# Patient Record
Sex: Female | Born: 1985 | Race: White | Hispanic: No | Marital: Married | Smoking: Never smoker
Health system: Southern US, Community
[De-identification: ages and names within clinical notes are randomized; demographics above are authoritative.]

## PROBLEM LIST (undated history)

## (undated) ENCOUNTER — Inpatient Hospital Stay (HOSPITAL_COMMUNITY): Payer: Self-pay

## (undated) DIAGNOSIS — R51 Headache: Secondary | ICD-10-CM

## (undated) HISTORY — PX: NO PAST SURGERIES: SHX2092

---

## 2011-08-13 LAB — ANTIBODY SCREEN: Antibody Screen: NEGATIVE

## 2011-08-13 LAB — RUBELLA ANTIBODY, IGM: Rubella: IMMUNE

## 2011-08-13 LAB — GC/CHLAMYDIA PROBE AMP, GENITAL: Chlamydia: NEGATIVE

## 2011-08-13 LAB — HIV ANTIBODY (ROUTINE TESTING W REFLEX): HIV: NONREACTIVE

## 2012-02-18 LAB — STREP B DNA PROBE: GBS: POSITIVE

## 2012-02-25 ENCOUNTER — Encounter (HOSPITAL_COMMUNITY): Payer: Self-pay | Admitting: *Deleted

## 2012-02-25 ENCOUNTER — Inpatient Hospital Stay (HOSPITAL_COMMUNITY)
Admission: AD | Admit: 2012-02-25 | Discharge: 2012-02-28 | DRG: 775 | Disposition: A | Discharge status: Home / Self Care | Payer: Medicaid Other | Source: Ambulatory Visit | Attending: Obstetrics and Gynecology | Admitting: Obstetrics and Gynecology

## 2012-02-25 DIAGNOSIS — O99892 Other specified diseases and conditions complicating childbirth: Secondary | ICD-10-CM | POA: Diagnosis present

## 2012-02-25 DIAGNOSIS — Z22330 Carrier of Group B streptococcus: Secondary | ICD-10-CM

## 2012-02-25 HISTORY — DX: Headache: R51

## 2012-02-25 LAB — CBC
HCT: 41.9 % (ref 36.0–46.0)
Hemoglobin: 14.7 g/dL (ref 12.0–15.0)
MCH: 31.3 pg (ref 26.0–34.0)
MCHC: 35.1 g/dL (ref 30.0–36.0)
MCV: 89.1 fL (ref 78.0–100.0)
RDW: 13.4 % (ref 11.5–15.5)

## 2012-02-25 MED ORDER — PHENYLEPHRINE 40 MCG/ML (10ML) SYRINGE FOR IV PUSH (FOR BLOOD PRESSURE SUPPORT)
80.0000 ug | PREFILLED_SYRINGE | INTRAVENOUS | Status: DC | PRN
  Filled 2012-02-25: qty 5

## 2012-02-25 MED ORDER — FLEET ENEMA 7-19 GM/118ML RE ENEM: 1.0000 | ENEMA | RECTAL | Status: DC | PRN

## 2012-02-25 MED ORDER — PHENYLEPHRINE 40 MCG/ML (10ML) SYRINGE FOR IV PUSH (FOR BLOOD PRESSURE SUPPORT): 80.0000 ug | PREFILLED_SYRINGE | INTRAVENOUS | Status: DC | PRN

## 2012-02-25 MED ORDER — LACTATED RINGERS IV SOLN: INTRAVENOUS | Status: DC

## 2012-02-25 MED ORDER — FENTANYL 2.5 MCG/ML BUPIVACAINE 1/10 % EPIDURAL INFUSION (WH - ANES)
14.0000 mL/h | INTRAMUSCULAR | Status: DC
  Filled 2012-02-25: qty 60

## 2012-02-25 MED ORDER — ONDANSETRON HCL 4 MG/2ML IJ SOLN: 4.0000 mg | Freq: Four times a day (QID) | INTRAMUSCULAR | Status: DC | PRN

## 2012-02-25 MED ORDER — ACETAMINOPHEN 325 MG PO TABS: 650.0000 mg | ORAL_TABLET | ORAL | Status: DC | PRN

## 2012-02-25 MED ORDER — IBUPROFEN 600 MG PO TABS: 600.0000 mg | ORAL_TABLET | Freq: Four times a day (QID) | ORAL | Status: DC | PRN

## 2012-02-25 MED ORDER — LACTATED RINGERS IV SOLN: 500.0000 mL | Freq: Once | INTRAVENOUS | Status: DC

## 2012-02-25 MED ORDER — OXYTOCIN BOLUS FROM INFUSION
500.0000 mL | Freq: Once | INTRAVENOUS | Status: DC
  Filled 2012-02-25: qty 500

## 2012-02-25 MED ORDER — CITRIC ACID-SODIUM CITRATE 334-500 MG/5ML PO SOLN
30.0000 mL | ORAL | Status: DC | PRN
  Administered 2012-02-26: 30 mL via ORAL
  Filled 2012-02-25: qty 15

## 2012-02-25 MED ORDER — DIPHENHYDRAMINE HCL 50 MG/ML IJ SOLN: 12.5000 mg | INTRAMUSCULAR | Status: DC | PRN

## 2012-02-25 MED ORDER — LACTATED RINGERS IV SOLN: 500.0000 mL | INTRAVENOUS | Status: DC | PRN

## 2012-02-25 MED ORDER — EPHEDRINE 5 MG/ML INJ: 10.0000 mg | INTRAVENOUS | Status: DC | PRN

## 2012-02-25 MED ORDER — OXYTOCIN 20 UNITS IN LACTATED RINGERS INFUSION - SIMPLE: 125.0000 mL/h | Freq: Once | INTRAVENOUS | Status: DC

## 2012-02-25 MED ORDER — LIDOCAINE HCL (PF) 1 % IJ SOLN: 30.0000 mL | INTRAMUSCULAR | Status: DC | PRN

## 2012-02-25 MED ORDER — PENICILLIN G POTASSIUM 5000000 UNITS IJ SOLR
5.0000 10*6.[IU] | Freq: Once | INTRAVENOUS | Status: DC
  Administered 2012-02-25: 5 10*6.[IU] via INTRAVENOUS
  Filled 2012-02-25: qty 5

## 2012-02-25 MED ORDER — OXYCODONE-ACETAMINOPHEN 5-325 MG PO TABS: 1.0000 | ORAL_TABLET | ORAL | Status: DC | PRN

## 2012-02-25 MED ORDER — EPHEDRINE 5 MG/ML INJ
10.0000 mg | INTRAVENOUS | Status: DC | PRN
  Filled 2012-02-25: qty 4

## 2012-02-25 MED ORDER — PENICILLIN G POTASSIUM 5000000 UNITS IJ SOLR
2.5000 10*6.[IU] | INTRAVENOUS | Status: DC
  Filled 2012-02-25: qty 2.5

## 2012-02-25 NOTE — MAU Note (Signed)
Pt reports "my stomach gets really tight at night", also reports ? Leaking  Fluid since 0430 this am. Reports good fetal movement. G1

## 2012-02-26 ENCOUNTER — Encounter (HOSPITAL_COMMUNITY): Payer: Self-pay

## 2012-02-26 ENCOUNTER — Encounter (HOSPITAL_COMMUNITY): Payer: Self-pay | Admitting: Anesthesiology

## 2012-02-26 ENCOUNTER — Inpatient Hospital Stay (HOSPITAL_COMMUNITY): Payer: Medicaid Other | Admitting: Anesthesiology

## 2012-02-26 ENCOUNTER — Encounter (HOSPITAL_COMMUNITY)
Admission: AD | Disposition: A | Discharge status: Home / Self Care | Payer: Self-pay | Source: Ambulatory Visit | Attending: Obstetrics and Gynecology

## 2012-02-26 LAB — COMPREHENSIVE METABOLIC PANEL
ALT: 14 U/L (ref 0–35)
AST: 23 U/L (ref 0–37)
CO2: 17 mEq/L — ABNORMAL LOW (ref 19–32)
Chloride: 103 mEq/L (ref 96–112)
Creatinine, Ser: 0.83 mg/dL (ref 0.50–1.10)
GFR calc non Af Amer: >90 mL/min (ref >90)
Sodium: 134 mEq/L — ABNORMAL LOW (ref 135–145)
Total Bilirubin: 0.7 mg/dL (ref 0.3–1.2)

## 2012-02-26 LAB — CBC
MCH: 30.5 pg (ref 26.0–34.0)
MCHC: 34.3 g/dL (ref 30.0–36.0)
MCV: 88.7 fL (ref 78.0–100.0)
Platelets: 247 10*3/uL (ref 150–400)
RDW: 13.6 % (ref 11.5–15.5)

## 2012-02-26 SURGERY — Surgical Case
Wound class: Clean Contaminated

## 2012-02-26 MED ORDER — LIDOCAINE HCL (PF) 1 % IJ SOLN: 30.0000 mL | INTRAMUSCULAR | Status: DC | PRN

## 2012-02-26 MED ORDER — OXYTOCIN 20 UNITS IN LACTATED RINGERS INFUSION - SIMPLE
INTRAVENOUS | Status: AC
  Filled 2012-02-26: qty 1000

## 2012-02-26 MED ORDER — ONDANSETRON HCL 4 MG/2ML IJ SOLN: 4.0000 mg | Freq: Four times a day (QID) | INTRAMUSCULAR | Status: DC | PRN

## 2012-02-26 MED ORDER — LOPERAMIDE HCL 2 MG PO CAPS
2.0000 mg | ORAL_CAPSULE | ORAL | Status: DC | PRN
  Filled 2012-02-26: qty 1

## 2012-02-26 MED ORDER — OXYCODONE-ACETAMINOPHEN 5-325 MG PO TABS: 1.0000 | ORAL_TABLET | ORAL | Status: DC | PRN

## 2012-02-26 MED ORDER — BENZOCAINE-MENTHOL 20-0.5 % EX AERO
1.0000 "application " | INHALATION_SPRAY | CUTANEOUS | Status: DC | PRN
  Administered 2012-02-26: 1 via TOPICAL

## 2012-02-26 MED ORDER — ONDANSETRON HCL 4 MG/2ML IJ SOLN: 4.0000 mg | INTRAMUSCULAR | Status: DC | PRN

## 2012-02-26 MED ORDER — WITCH HAZEL-GLYCERIN EX PADS: 1.0000 "application " | MEDICATED_PAD | CUTANEOUS | Status: DC | PRN

## 2012-02-26 MED ORDER — SIMETHICONE 80 MG PO CHEW: 80.0000 mg | CHEWABLE_TABLET | ORAL | Status: DC | PRN

## 2012-02-26 MED ORDER — IBUPROFEN 600 MG PO TABS: 600.0000 mg | ORAL_TABLET | Freq: Four times a day (QID) | ORAL | Status: DC | PRN

## 2012-02-26 MED ORDER — BENZOCAINE-MENTHOL 20-0.5 % EX AERO
INHALATION_SPRAY | CUTANEOUS | Status: AC
  Filled 2012-02-26: qty 56

## 2012-02-26 MED ORDER — PRENATAL MULTIVITAMIN CH
1.0000 | ORAL_TABLET | Freq: Every day | ORAL | Status: DC
  Administered 2012-02-26 – 2012-02-28 (×3): 1 via ORAL
  Filled 2012-02-26 (×3): qty 1

## 2012-02-26 MED ORDER — LIDOCAINE HCL (PF) 1 % IJ SOLN
INTRAMUSCULAR | Status: AC
  Filled 2012-02-26: qty 30

## 2012-02-26 MED ORDER — CITRIC ACID-SODIUM CITRATE 334-500 MG/5ML PO SOLN: 30.0000 mL | ORAL | Status: DC | PRN

## 2012-02-26 MED ORDER — LIDOCAINE HCL (PF) 1 % IJ SOLN
INTRAMUSCULAR | Status: DC | PRN
  Administered 2012-02-26 (×2): 8 mL
  Administered 2012-02-26: 30 mL

## 2012-02-26 MED ORDER — IBUPROFEN 600 MG PO TABS
600.0000 mg | ORAL_TABLET | Freq: Four times a day (QID) | ORAL | Status: DC
  Administered 2012-02-26 – 2012-02-28 (×9): 600 mg via ORAL
  Filled 2012-02-26 (×10): qty 1

## 2012-02-26 MED ORDER — ONDANSETRON HCL 4 MG PO TABS: 4.0000 mg | ORAL_TABLET | ORAL | Status: DC | PRN

## 2012-02-26 MED ORDER — CARBOPROST TROMETHAMINE 250 MCG/ML IM SOLN
250.0000 ug | Freq: Once | INTRAMUSCULAR | Status: AC
  Administered 2012-02-26: 250 ug via INTRAMUSCULAR

## 2012-02-26 MED ORDER — LACTATED RINGERS IV SOLN: 500.0000 mL | INTRAVENOUS | Status: DC | PRN

## 2012-02-26 MED ORDER — MEASLES, MUMPS & RUBELLA VAC ~~LOC~~ INJ
0.5000 mL | INJECTION | Freq: Once | SUBCUTANEOUS | Status: DC
  Filled 2012-02-26: qty 0.5

## 2012-02-26 MED ORDER — LANOLIN HYDROUS EX OINT: TOPICAL_OINTMENT | CUTANEOUS | Status: DC | PRN

## 2012-02-26 MED ORDER — DIBUCAINE 1 % RE OINT: 1.0000 "application " | TOPICAL_OINTMENT | RECTAL | Status: DC | PRN

## 2012-02-26 MED ORDER — FENTANYL 2.5 MCG/ML BUPIVACAINE 1/10 % EPIDURAL INFUSION (WH - ANES)
INTRAMUSCULAR | Status: DC | PRN
  Administered 2012-02-26: 14 mL/h via EPIDURAL

## 2012-02-26 MED ORDER — SENNOSIDES-DOCUSATE SODIUM 8.6-50 MG PO TABS
2.0000 | ORAL_TABLET | Freq: Every day | ORAL | Status: DC
  Administered 2012-02-26: 2 via ORAL

## 2012-02-26 MED ORDER — FLEET ENEMA 7-19 GM/118ML RE ENEM: 1.0000 | ENEMA | RECTAL | Status: DC | PRN

## 2012-02-26 MED ORDER — DIPHENHYDRAMINE HCL 25 MG PO CAPS: 25.0000 mg | ORAL_CAPSULE | Freq: Four times a day (QID) | ORAL | Status: DC | PRN

## 2012-02-26 MED ORDER — LACTATED RINGERS IV SOLN: INTRAVENOUS | Status: DC

## 2012-02-26 MED ORDER — OXYTOCIN 20 UNITS IN LACTATED RINGERS INFUSION - SIMPLE: 125.0000 mL/h | INTRAVENOUS | Status: AC

## 2012-02-26 MED ORDER — LOPERAMIDE HCL 2 MG PO CAPS
4.0000 mg | ORAL_CAPSULE | Freq: Once | ORAL | Status: AC
  Administered 2012-02-26: 4 mg via ORAL
  Filled 2012-02-26: qty 2

## 2012-02-26 MED ORDER — ACETAMINOPHEN 325 MG PO TABS: 650.0000 mg | ORAL_TABLET | ORAL | Status: DC | PRN

## 2012-02-26 MED ORDER — PRENATAL MULTIVITAMIN CH: 1.0000 | ORAL_TABLET | Freq: Every day | ORAL | Status: DC

## 2012-02-26 MED ORDER — TETANUS-DIPHTH-ACELL PERTUSSIS 5-2.5-18.5 LF-MCG/0.5 IM SUSP: 0.5000 mL | Freq: Once | INTRAMUSCULAR | Status: DC

## 2012-02-26 MED ORDER — OXYTOCIN BOLUS FROM INFUSION
500.0000 mL | Freq: Once | INTRAVENOUS | Status: DC
  Administered 2012-02-26: 500 mL via INTRAVENOUS

## 2012-02-26 MED ORDER — BUTORPHANOL TARTRATE 2 MG/ML IJ SOLN: 1.0000 mg | Freq: Once | INTRAMUSCULAR | Status: DC

## 2012-02-26 MED ORDER — ZOLPIDEM TARTRATE 5 MG PO TABS: 5.0000 mg | ORAL_TABLET | Freq: Every evening | ORAL | Status: DC | PRN

## 2012-02-26 MED ORDER — OXYCODONE-ACETAMINOPHEN 5-325 MG PO TABS
1.0000 | ORAL_TABLET | ORAL | Status: DC | PRN
  Administered 2012-02-26: 1 via ORAL
  Filled 2012-02-26: qty 1

## 2012-02-26 MED ORDER — CARBOPROST TROMETHAMINE 250 MCG/ML IM SOLN
INTRAMUSCULAR | Status: AC
  Administered 2012-02-26: 250 ug via INTRAMUSCULAR
  Filled 2012-02-26: qty 1

## 2012-02-26 MED ORDER — OXYTOCIN 20 UNITS IN LACTATED RINGERS INFUSION - SIMPLE
125.0000 mL/h | Freq: Once | INTRAVENOUS | Status: DC
Start: 2012-02-26 — End: 2012-02-26

## 2012-02-26 SURGICAL SUPPLY — 30 items
CLOTH BEACON ORANGE TIMEOUT ST (SAFETY) IMPLANT
CONTAINER PREFILL 10% NBF 15ML (MISCELLANEOUS) IMPLANT
DRESSING TELFA 8X3 (GAUZE/BANDAGES/DRESSINGS) IMPLANT
DRSG VASELINE 3X18 (GAUZE/BANDAGES/DRESSINGS) IMPLANT
ELECT REM PT RETURN 9FT ADLT (ELECTROSURGICAL)
ELECTRODE REM PT RTRN 9FT ADLT (ELECTROSURGICAL) IMPLANT
EXTRACTOR VACUUM KIWI (MISCELLANEOUS) IMPLANT
EXTRACTOR VACUUM M CUP 4 TUBE (SUCTIONS) IMPLANT
GAUZE SPONGE 4X4 12PLY STRL LF (GAUZE/BANDAGES/DRESSINGS) IMPLANT
GLOVE BIO SURGEON STRL SZ7.5 (GLOVE) IMPLANT
GOWN PREVENTION PLUS LG XLONG (DISPOSABLE) IMPLANT
GOWN PREVENTION PLUS XLARGE (GOWN DISPOSABLE) IMPLANT
KIT ABG SYR 3ML LUER SLIP (SYRINGE) IMPLANT
NEEDLE HYPO 25X5/8 SAFETYGLIDE (NEEDLE) IMPLANT
NS IRRIG 1000ML POUR BTL (IV SOLUTION) IMPLANT
PACK C SECTION WH (CUSTOM PROCEDURE TRAY) IMPLANT
PAD ABD 7.5X8 STRL (GAUZE/BANDAGES/DRESSINGS) IMPLANT
RTRCTR C-SECT PINK 25CM LRG (MISCELLANEOUS) IMPLANT
SLEEVE SCD COMPRESS KNEE MED (MISCELLANEOUS) IMPLANT
STAPLER VISISTAT 35W (STAPLE) IMPLANT
SUT PLAIN 0 NONE (SUTURE) IMPLANT
SUT VIC AB 0 CT1 36 (SUTURE) IMPLANT
SUT VIC AB 3-0 CTX 36 (SUTURE) IMPLANT
SUT VIC AB 3-0 SH 27 (SUTURE)
SUT VIC AB 3-0 SH 27X BRD (SUTURE) IMPLANT
SUT VIC AB 4-0 KS 27 (SUTURE) IMPLANT
SUT VICRYL 0 TIES 12 18 (SUTURE) IMPLANT
TOWEL OR 17X24 6PK STRL BLUE (TOWEL DISPOSABLE) IMPLANT
TRAY FOLEY CATH 14FR (SET/KITS/TRAYS/PACK) IMPLANT
WATER STERILE IRR 1000ML POUR (IV SOLUTION) IMPLANT

## 2012-02-26 NOTE — Anesthesia Procedure Notes (Signed)
Epidural Patient location during procedure: OB Start time: 02/26/2012 12:23 AM End time: 02/26/2012 12:27 AM Reason for block: procedure for pain  Staffing Anesthesiologist: Sandrea Hughs Performed by: anesthesiologist   Preanesthetic Checklist Completed: patient identified, site marked, surgical consent, pre-op evaluation, timeout performed, IV checked, risks and benefits discussed and monitors and equipment checked  Epidural Patient position: sitting Prep: site prepped and draped and DuraPrep Patient monitoring: continuous pulse ox and blood pressure Approach: midline Injection technique: LOR air  Needle:  Needle type: Tuohy  Needle gauge: 17 G Needle length: 9 cm Needle insertion depth: 5 cm cm Catheter type: closed end flexible Catheter size: 19 Gauge Catheter at skin depth: 10 cm Test dose: negative  Assessment Sensory level: T9 Events: blood not aspirated, injection not painful, no injection resistance, negative IV test and no paresthesia

## 2012-02-26 NOTE — Transfer of Care (Signed)
Immediate Anesthesia Transfer of Care Note  Patient: Christine Blair  Procedure(s) Performed: Procedure(s) (LRB): VAGINAL DELIVERY ()  Patient Location: L+D  Anesthesia Type: Epidural  Level of Consciousness: Awake  Airway & Oxygen Therapy: None  Post-op Assessment: Stable  Post vital signs: VSS  Complications: None

## 2012-02-26 NOTE — Progress Notes (Signed)
UR Chart review completed.  

## 2012-02-26 NOTE — Progress Notes (Signed)
Patient ID: Christine Blair, female   DOB: 07/28/1986, 26 y.o.   MRN: [id number] Pt still passing some clots. 75-100cc's of clots removed from the uterus and hemabate given.

## 2012-02-26 NOTE — H&P (Signed)
Christine Blair, Christine Blair               ACCOUNT NO.:  [id number]  MEDICAL RECORD NO.:  [id number]  LOCATION:  9163                          FACILITY:  [facility]  PHYSICIAN:  Malachi Pro. Ambrose Mantle, M.D. DATE OF BIRTH:  04-30-1986  DATE OF ADMISSION:  02/25/2012 DATE OF DISCHARGE:                             HISTORY & PHYSICAL   HISTORY OF PRESENT ILLNESS:  This is a 26 year old, white female gravida 1, EDC March 16, 2012 who was admitted in labor.  Blood group and type O positive, negative antibody.  Pap smear normal.  Rubella immune.  RPR nonreactive.  Urine culture negative.  Hepatitis B surface antigen negative, HIV negative, GC and Chlamydia negative.  First trimester screen declined, AFP declined.  A 1-hour Glucola 107.  Group B strep positive.  The patient had a relatively benign prenatal course.  She came to the hospital for evaluation of contractions, was not thought to be contracting significantly, but had spontaneous rupture of membranes, and was admitted.  PAST MEDICAL HISTORY:  ALLERGIES:  Reveals no known allergies.  Medical history of headaches, but no other significant problems.  PAST SURGICAL HISTORY:  No surgical history.  FAMILY HISTORY:  Paternal grandmother with liver cancer.  Maternal grandmother with diabetes.  SOCIAL HISTORY:  The patient states that she does not use alcohol, tobacco, or illicit drugs.  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  PHYSICAL EXAMINATION:  VITAL SIGNS:  On admission, temperature 98.8, pulse 79, respirations 16, blood pressure 131/78. HEART:  Heart normal size and sounds.  No murmurs.  LUNGS:  Clear to auscultation. ABDOMEN:  Soft, at her last prenatal visit on February 23, 2012. PELVIC:  Her fundal height was 35 cm.  Fetal heart tones were normal. The cervix was 6-7 cm, 100%, and the patient has ruptured membranes.  ADMITTING IMPRESSION:  Intrauterine pregnancy at 37 weeks and 1 day, in active labor.  The patient is admitted, placed on penicillin  and we have requested an epidural.    Malachi Pro. Ambrose Mantle, M.D.    TFH/MEDQ  D:  02/26/2012  T:  02/26/2012  Job:  [id number]

## 2012-02-26 NOTE — Progress Notes (Signed)
Patient ID: Christine Blair, female   DOB: 1986-07-03, 26 y.o.   MRN: [id number] I called the room and was told there was bradycardia for 3 minutes. I entered the room and the FHR was mostly in the 80's I gave scalp stimulation without significant benefit and multiple position changes were no beneficial so we called the OR for a stat c section and the pt was given ephedrine. When we arrived in the OR the FHR was normal and the pt pushed with several contractions without decelerations so the pt was returned to her room. The vertex is probably LOP and only a tiny amount of caput is visible with pushing.

## 2012-02-26 NOTE — Progress Notes (Signed)
Patient ID: Christine Blair, female   DOB: [birth year], 26 y.o.   MRN: [id number] DOD Bleeding is fine No problems.BP borderline Labs normal.

## 2012-02-26 NOTE — Progress Notes (Signed)
Patient ID: Christine Blair, female   DOB: [birth year], 26 y.o.   MRN: [id number] Delivery note: tHE PT FINALLY MADE PROGRESS AND DELIVERED SPONTANEOUSLY LOP OVER AN INTACT PERINEUM A LIVING 5 POUND 12 OUNCE FEMALE INFANT WITH APGARS OF 8 AND 8 AT 1 AND 5 MINUTES. PLACENTA DELIVERED INTACT AND THE UTERUS WAS NORMAL.A RIGHT LABIAL LACERATION WAS SUTURED FOR HEMOSTASIS AND A LEFT LABIAL LACERATION WAS HEMOSTATIC AND NOT REPAIRED. EBL 500CC'S. PIH LABS WERE DONE BECAUSE OF BORDERLINE HIGH BP'S

## 2012-02-26 NOTE — Anesthesia Preprocedure Evaluation (Signed)
Anesthesia Evaluation  Patient identified by MRN, date of birth, ID band Patient awake    Reviewed: Allergy & Precautions, H&P , Patient's Chart, lab work & pertinent test results  Airway Mallampati: I TM Distance: >3 FB Neck ROM: full    Dental No notable dental hx.    Pulmonary neg pulmonary ROS,  breath sounds clear to auscultation  Pulmonary exam normal       Cardiovascular negative cardio ROS      Neuro/Psych negative psych ROS   GI/Hepatic negative GI ROS, Neg liver ROS,   Endo/Other  negative endocrine ROS  Renal/GU negative Renal ROS  negative genitourinary   Musculoskeletal negative musculoskeletal ROS (+)   Abdominal Normal abdominal exam  (+)   Peds negative pediatric ROS (+)  Hematology negative hematology ROS (+)   Anesthesia Other Findings   Reproductive/Obstetrics (+) Pregnancy                           Anesthesia Physical Anesthesia Plan  ASA: II  Anesthesia Plan: Epidural   Post-op Pain Management:    Induction:   Airway Management Planned:   Additional Equipment:   Intra-op Plan:   Post-operative Plan:   Informed Consent: I have reviewed the patients History and Physical, chart, labs and discussed the procedure including the risks, benefits and alternatives for the proposed anesthesia with the patient or authorized representative who has indicated his/her understanding and acceptance.     Plan Discussed with:   Anesthesia Plan Comments:         Anesthesia Quick Evaluation

## 2012-02-26 NOTE — OR Nursing (Signed)
Patient to OR room 2 for Stat Cesarean section for nonreassuring fetal heart rate.  After entrance to OR heart rate recovered and Dr. Ambrose Mantle decided to have patient return to birthing suites.   Cesarean section was not performed.

## 2012-02-27 LAB — CBC
HCT: 30.2 % — ABNORMAL LOW (ref 36.0–46.0)
Hemoglobin: 10.1 g/dL — ABNORMAL LOW (ref 12.0–15.0)
MCV: 91 fL (ref 78.0–100.0)
Platelets: 195 10*3/uL (ref 150–400)
RBC: 3.32 MIL/uL — ABNORMAL LOW (ref 3.87–5.11)
WBC: 19.1 10*3/uL — ABNORMAL HIGH (ref 4.0–10.5)

## 2012-02-27 NOTE — Progress Notes (Signed)
Patient ID: Christine Blair, female   DOB: 05-23-1986, 26 y.o.   MRN: [id number] #1 afebrile BP normal no complaints HGB acceptable.

## 2012-02-27 NOTE — Anesthesia Postprocedure Evaluation (Signed)
Anesthesia Post Note  Patient: Christine Blair  Procedure(s) Performed: * No procedures listed *  Anesthesia type: Epidural  Patient location: Mother/Baby  Post pain: Pain level controlled  Post assessment: Post-op Vital signs reviewed  Last Vitals:  Filed Vitals:   02/26/12 2110  BP: 134/80  Pulse: 94  Temp: 36.7 C  Resp: 18    Post vital signs: Reviewed  Level of consciousness: awake  Complications: No apparent anesthesia complications

## 2012-02-27 NOTE — Addendum Note (Signed)
Addendum  created 02/27/12 0900 by Shanon Payor, CRNA   Modules edited:Notes Section

## 2012-02-27 NOTE — Anesthesia Postprocedure Evaluation (Signed)
  Anesthesia Post-op Note  Patient: Christine Blair  Procedure(s) Performed: * No procedures listed *  Patient Location: Mother/Baby  Anesthesia Type: Epidural  Level of Consciousness: awake, alert  and oriented  Airway and Oxygen Therapy: Patient Spontanous Breathing  Post-op Pain: none  Post-op Assessment: Post-op Vital signs reviewed, Patient's Cardiovascular Status Stable, No headache, No backache, No residual numbness and No residual motor weakness  Post-op Vital Signs: Reviewed and stable  Complications: No apparent anesthesia complications

## 2012-02-28 NOTE — Discharge Instructions (Signed)
As per discharge pamphlet °

## 2012-02-28 NOTE — Discharge Summary (Signed)
Obstetric Discharge Summary Reason for Admission: onset of labor Prenatal Procedures: none Intrapartum Procedures: spontaneous vaginal delivery Postpartum Procedures: none Complications-Operative and Postpartum: right labial laceration Hemoglobin  Date Value Range Status  02/27/2012 10.1* 12.0-15.0 (g/dL) Final     REPEATED TO VERIFY     DELTA CHECK NOTED     HCT  Date Value Range Status  02/27/2012 30.2* 36.0-46.0 (%) Final    Physical Exam:  General: alert Lochia: appropriate Uterine Fundus: firm  Discharge Diagnoses: Term Pregnancy-delivered  Discharge Information: Date: 02/28/2012 Activity: pelvic rest Diet: routine Medications: Ibuprofen Condition: stable Instructions: refer to practice specific booklet Discharge to: home Follow-up Information    Follow up with Bing Plume, MD. Schedule an appointment as soon as possible for a visit in 6 weeks.   Contact information:   [employer] [employer]. [address] [place], [address] [place] [address] [phone]          Newborn Data: Live born female  Birth Weight: 5 lb 12 oz (2608 g) APGAR: 8, 8  Home with mother.  Christine Blair D 02/28/2012, 9:27 AM

## 2012-03-01 ENCOUNTER — Encounter (HOSPITAL_COMMUNITY): Payer: Self-pay | Admitting: Obstetrics and Gynecology

## 2012-11-09 ENCOUNTER — Emergency Department: Payer: Self-pay | Admitting: Emergency Medicine

## 2014-02-11 ENCOUNTER — Encounter (HOSPITAL_COMMUNITY): Payer: Self-pay | Admitting: *Deleted

## 2014-02-11 ENCOUNTER — Inpatient Hospital Stay (HOSPITAL_COMMUNITY): Payer: Medicaid Other

## 2014-02-11 ENCOUNTER — Inpatient Hospital Stay (HOSPITAL_COMMUNITY)
Admission: AD | Admit: 2014-02-11 | Discharge: 2014-02-11 | Disposition: A | Discharge status: Home / Self Care | Payer: Medicaid Other | Source: Ambulatory Visit | Attending: Obstetrics and Gynecology | Admitting: Obstetrics and Gynecology

## 2014-02-11 DIAGNOSIS — O200 Threatened abortion: Secondary | ICD-10-CM | POA: Insufficient documentation

## 2014-02-11 LAB — CBC
HEMATOCRIT: 39.7 % (ref 36.0–46.0)
HEMOGLOBIN: 13.6 g/dL (ref 12.0–15.0)
MCH: 29.7 pg (ref 26.0–34.0)
MCHC: 34.3 g/dL (ref 30.0–36.0)
MCV: 86.7 fL (ref 78.0–100.0)
Platelets: 288 10*3/uL (ref 150–400)
RBC: 4.58 MIL/uL (ref 3.87–5.11)
RDW: 14.1 % (ref 11.5–15.5)
WBC: 9.4 10*3/uL (ref 4.0–10.5)

## 2014-02-11 LAB — ABO/RH: ABO/RH(D): O POS

## 2014-02-11 LAB — URINE MICROSCOPIC-ADD ON

## 2014-02-11 LAB — URINALYSIS, ROUTINE W REFLEX MICROSCOPIC
BILIRUBIN URINE: NEGATIVE
Glucose, UA: NEGATIVE mg/dL
KETONES UR: NEGATIVE mg/dL
LEUKOCYTES UA: NEGATIVE
NITRITE: NEGATIVE
PROTEIN: NEGATIVE mg/dL
Specific Gravity, Urine: 1.01 (ref 1.005–1.030)
Urobilinogen, UA: 1 mg/dL (ref 0.0–1.0)
pH: 5.5 (ref 5.0–8.0)

## 2014-02-11 LAB — HCG, QUANTITATIVE, PREGNANCY: HCG, BETA CHAIN, QUANT, S: 6837 m[IU]/mL — AB (ref <5)

## 2014-02-11 LAB — POCT PREGNANCY, URINE: Preg Test, Ur: POSITIVE — AB

## 2014-02-11 NOTE — MAU Note (Signed)
Pt states here for bleeding. Had spotting past two days, then this am had large gush of blood. No clots noted. Denies pain. First u/s scheduled for this Monday. Denies vag d/c changes.

## 2014-02-11 NOTE — Discharge Instructions (Signed)
Threatened Miscarriage  Bleeding during the first 20 weeks of pregnancy is common. This is sometimes called a threatened miscarriage. This is a pregnancy that is threatening to end before the twentieth week of pregnancy. Often this bleeding stops with bed rest or decreased activities as suggested by your caregiver and the pregnancy continues without any more problems. You may be asked to not have sexual intercourse, have orgasms or use tampons until further notice. Sometimes a threatened miscarriage can progress to a complete or incomplete miscarriage. This may or may not require further treatment. Some miscarriages occur before a woman misses a menstrual period and knows she is pregnant.  Miscarriages occur in 15 to 20% of all pregnancies and usually occur during the first 13 weeks of the pregnancy. The exact cause of a miscarriage is usually never known. A miscarriage is natures way of ending a pregnancy that is abnormal or would not make it to term. There are some things that may put you at risk to have a miscarriage, such as:  · Hormone problems.  · Infection of the uterus or cervix.  · Chronic illness, diabetes for example, especially if it is not controlled.  · Abnormal shaped uterus.  · Fibroids in the uterus.  · Incompetent cervix (the cervix is too weak to hold the baby).  · Smoking.  · Drinking too much alcohol. It's best not to drink any alcohol when you are pregnant.  · Taking illegal drugs.  TREATMENT   When a miscarriage becomes complete and all products of conception (all the tissue in the uterus) have been passed, often no treatment is needed. If you think you passed tissue, save it in a container and take it to your doctor for evaluation. If the miscarriage is incomplete (parts of the fetus or placenta remain in the uterus), further treatment may be needed. The most common reason for further treatment is continued bleeding (hemorrhage) because pregnancy tissue did not pass out of the uterus. This  often occurs if a miscarriage is incomplete. Tissue left behind may also become infected. Treatment usually is dilatation and curettage (the removal of the remaining products of pregnancy. This can be done by a simple sucking procedure (suction curettage) or a simple scraping of the inside of the uterus. This may be done in the hospital or in the caregiver's office. This is only done when your caregiver knows that there is no chance for the pregnancy to proceed to term. This is determined by physical examination, negative pregnancy test, falling pregnancy hormone count and/or, an ultrasound revealing a dead fetus.  Miscarriages are often a very emotional time for prospective mothers and fathers. This is not you or your partners fault. It did not occur because of an inadequacy in you or your partner. Nearly all miscarriages occur because the pregnancy has started off wrongly. At least half of these pregnancies have a chromosomal abnormality. It is almost always not inherited. Others may have developmental problems with the fetus or placenta. This does not always show up even when the products miscarried are studied under the microscope. The miscarriage is nearly always not your fault and it is not likely that you could have prevented it from happening. If you are having emotional and grieving problems, talk to your health care provider and even seek counseling, if necessary, before getting pregnant again. You can begin trying for another pregnancy as soon as your caregiver says it is OK.  HOME CARE INSTRUCTIONS   · Your caregiver may order   bed rest depending on how much bleeding and cramping you are having. You may be limited to only getting up to go to the bathroom. You may be allowed to continue light activity. You may need to make arrangements for the care of your other children and for any other responsibilities.  · Keep track of the number of pads you use each day, how often you have to change pads and how  saturated (soaked) they are. Record this information.  · DO NOT USE TAMPONS. Do not douche, have sexual intercourse or orgasms until approved by your caregiver.  · You may receive a follow up appointment for re-evaluation of your pregnancy and a repeat blood test. Re-evaluation often occurs after 2 days and again in 4 to 6 weeks. It is very important that you follow-up in the recommended time period.  · If you are Rh negative and the father is Rh positive or you do not know the fathers' blood type, you may receive a shot (Rh immune globulin) to help prevent abnormal antibodies that can develop and affect the baby in any future pregnancies.  SEEK IMMEDIATE MEDICAL CARE IF:  · You have severe cramps in your stomach, back, or abdomen.  · You have a sudden onset of severe pain in the lower part of your abdomen.  · You develop chills.  · You run an unexplained temperature of 101° F (38.3° C) or higher.  · You pass large clots or tissue. Save any tissue for your caregiver to inspect.  · Your bleeding increases or you become light-headed, weak, or have fainting episodes.  · You have a gush of fluid from your vagina.  · You pass out. This could mean you have a tubal (ectopic) pregnancy.  Document Released: 11/10/2005 Document Revised: 02/02/2012 Document Reviewed: 06/26/2008  ExitCare® Patient Information ©2014 [employer].

## 2014-02-11 NOTE — MAU Provider Note (Signed)
History     CSN: [id number]  Arrival date and time: 02/11/14 1516   None     Chief Complaint  Patient presents with  . Vaginal Bleeding   Vaginal Bleeding    EGAN SAHLIN is a 28 y.o. G2P1001 at [redacted]w[redacted]d who presents today with vaginal bleeding. She states that she started with pink spotting last night, but this morning it was bright red. She states that it is not as heavy as period, but is more than just spotting. She denies any pain at this time.   Past Medical History  Diagnosis Date  . EXBMWUXL(244.0)     Past Surgical History  Procedure Laterality Date  . No past surgeries    . Vaginal delivery  02/26/2012    Procedure: VAGINAL DELIVERY;  Surgeon: Bing Plume, MD;  Location: [facility];  Service: Gynecology;;  Fetal heart rate recovered, surgical procedure not performed     Family History  Problem Relation Age of Onset  . Anesthesia problems Neg Hx     History  Substance Use Topics  . Smoking status: Never Smoker   . Smokeless tobacco: Not on file  . Alcohol Use: No    Allergies: No Known Allergies  Prescriptions prior to admission  Medication Sig Dispense Refill  . acetaminophen (TYLENOL) 325 MG tablet Take 650 mg by mouth every 6 (six) hours as needed for mild pain.       . cephALEXin (KEFLEX) 500 MG capsule Take 500 mg by mouth every 6 (six) hours. X 7 days      . ibuprofen (ADVIL,MOTRIN) 600 MG tablet Take 600 mg by mouth every 6 (six) hours. X 7 days      . Prenatal Vit-Fe Fumarate-FA (PRENATAL MULTIVITAMIN) TABS Take 1 tablet by mouth daily.        Review of Systems  Genitourinary: Positive for vaginal bleeding.   Physical Exam   Blood pressure 136/79, pulse 104, temperature 98.4 F (36.9 C), temperature source Oral, resp. rate 18, height 5' 1.5" (1.562 m), weight 51.767 kg (114 lb 2 oz), last menstrual period 12/08/2013, not currently breastfeeding.  Physical Exam  Nursing note and vitals reviewed. Constitutional: She is oriented to person,  place, and time. She appears well-developed and well-nourished. No distress.  Cardiovascular: Normal rate.   Respiratory: Effort normal.  GI: Soft. There is no tenderness.  Neurological: She is alert and oriented to person, place, and time.  Skin: Skin is warm and dry.  Psychiatric: She has a normal mood and affect.    MAU Course  Procedures  Results for orders placed during the hospital encounter of 02/11/14 (from the past 24 hour(s))  URINALYSIS, ROUTINE W REFLEX MICROSCOPIC     Status: Abnormal   Collection Time    02/11/14  3:33 PM      Result Value Ref Range   Color, Urine YELLOW  YELLOW   APPearance CLEAR  CLEAR   Specific Gravity, Urine 1.010  1.005 - 1.030   pH 5.5  5.0 - 8.0   Glucose, UA NEGATIVE  NEGATIVE mg/dL   Hgb urine dipstick LARGE (*) NEGATIVE   Bilirubin Urine NEGATIVE  NEGATIVE   Ketones, ur NEGATIVE  NEGATIVE mg/dL   Protein, ur NEGATIVE  NEGATIVE mg/dL   Urobilinogen, UA 1.0  0.0 - 1.0 mg/dL   Nitrite NEGATIVE  NEGATIVE   Leukocytes, UA NEGATIVE  NEGATIVE  URINE MICROSCOPIC-ADD ON     Status: Abnormal   Collection Time    02/11/14  3:33 PM      Result Value Ref Range   Squamous Epithelial / LPF FEW (*) RARE   WBC, UA 0-2  <3 WBC/hpf   RBC / HPF 0-2  <3 RBC/hpf   Bacteria, UA FEW (*) RARE  POCT PREGNANCY, URINE     Status: Abnormal   Collection Time    02/11/14  3:49 PM      Result Value Ref Range   Preg Test, Ur POSITIVE (*) NEGATIVE  CBC     Status: None   Collection Time    02/11/14  4:01 PM      Result Value Ref Range   WBC 9.4  4.0 - 10.5 K/uL   RBC 4.58  3.87 - 5.11 MIL/uL   Hemoglobin 13.6  12.0 - 15.0 g/dL   HCT 40.9  73.5 - 32.9 %   MCV 86.7  78.0 - 100.0 fL   MCH 29.7  26.0 - 34.0 pg   MCHC 34.3  30.0 - 36.0 g/dL   RDW 92.4  26.8 - 34.1 %   Platelets 288  150 - 400 K/uL  ABO/RH     Status: None   Collection Time    02/11/14  4:01 PM      Result Value Ref Range   ABO/RH(D) O POS    HCG, QUANTITATIVE, PREGNANCY     Status:  Abnormal   Collection Time    02/11/14  4:01 PM      Result Value Ref Range   hCG, Beta Chain, Quant, S 6837 (*) <5 mIU/mL   US Ob Comp Less 14 Wks  02/11/2014   CLINICAL DATA:  Bleeding, pregnant  EXAM: OBSTETRIC <14 WK [place] AND TRANSVAGINAL OB US  TECHNIQUE: Both transabdominal and transvaginal ultrasound examinations were performed for complete evaluation of the gestation as well as the maternal uterus, adnexal regions, and pelvic cul-de-sac. Transvaginal technique was performed to assess early pregnancy.  COMPARISON:  None.  FINDINGS: Intrauterine gestational sac: Present within uterus, slightly irregular  Yolk sac:  Not identified  Embryo: Not definitely identified ; small irregular focus identified at margin of gestational sac, uncertain if represents fetal pole or debris, 2.8 mm length  Cardiac Activity: N/A  Heart Rate:  N/A bpm  MSD:  18.3  mm   6 w   6  d       [place] EDC: 09/30/2014  Maternal uterus/adnexae: No subchorionic hemorrhage.  Right ovary normal size and morphology 2.9 x 2.0 x 2.6 cm.  Left ovary normal size and morphology 3.1 x 1.2 x 1.0 cm.  No free pelvic fluid or adnexal masses.  IMPRESSION: Gestational sac is seen within the uterus, slightly irregular, without definite visualization of a yolk sac.  A small peripheral soft tissue nodular component is seen within the gestational sac, potentially could represent a tiny fetal pole or debris.  Unable to establish viability.  Consider followup ultrasound in 10-14 days to reassess.   Electronically Signed   By: Ulyses Southward M.D.   On: 02/11/2014 16:58   US Ob Transvaginal  02/11/2014   CLINICAL DATA:  Bleeding, pregnant  EXAM: OBSTETRIC <14 WK [place] AND TRANSVAGINAL OB US  TECHNIQUE: Both transabdominal and transvaginal ultrasound examinations were performed for complete evaluation of the gestation as well as the maternal uterus, adnexal regions, and pelvic cul-de-sac. Transvaginal technique was performed to assess early pregnancy.  COMPARISON:   None.  FINDINGS: Intrauterine gestational sac: Present within uterus, slightly irregular  Yolk sac:  Not identified  Embryo: Not definitely identified ; small irregular focus identified at margin of gestational sac, uncertain if represents fetal pole or debris, 2.8 mm length  Cardiac Activity: N/A  Heart Rate:  N/A bpm  MSD:  18.3  mm   6 w   6  d       US EDC: 09/30/2014  Maternal uterus/adnexae: No subchorionic hemorrhage.  Right ovary normal size and morphology 2.9 x 2.0 x 2.6 cm.  Left ovary normal size and morphology 3.1 x 1.2 x 1.0 cm.  No free pelvic fluid or adnexal masses.  IMPRESSION: Gestational sac is seen within the uterus, slightly irregular, without definite visualization of a yolk sac.  A small peripheral soft tissue nodular component is seen within the gestational sac, potentially could represent a tiny fetal pole or debris.  Unable to establish viability.  Consider followup ultrasound in 10-14 days to reassess.   Electronically Signed   By: Ulyses SouthwardMark  Boles M.D.   On: 02/11/2014 16:58     Assessment and Plan   1. Threatened abortion in first trimester    Bleeding precautions Return to MAU as needed  Follow-up Information   [facility] (As scheduled)    Contact information:   [address]  [address] [place] [address] [phone]        Tawnya CrookHogan, Heather Donovan 02/11/2014, 4:13 PM

## 2014-02-13 ENCOUNTER — Encounter (HOSPITAL_COMMUNITY): Payer: Medicaid Other | Admitting: Anesthesiology

## 2014-02-13 ENCOUNTER — Inpatient Hospital Stay (HOSPITAL_COMMUNITY): Payer: Medicaid Other | Admitting: Anesthesiology

## 2014-02-13 ENCOUNTER — Encounter (HOSPITAL_COMMUNITY): Payer: Self-pay | Admitting: *Deleted

## 2014-02-13 ENCOUNTER — Encounter (HOSPITAL_COMMUNITY)
Admission: AD | Disposition: A | Discharge status: Home / Self Care | Payer: Self-pay | Source: Ambulatory Visit | Attending: Obstetrics and Gynecology

## 2014-02-13 ENCOUNTER — Inpatient Hospital Stay (HOSPITAL_COMMUNITY): Payer: Medicaid Other

## 2014-02-13 ENCOUNTER — Ambulatory Visit (HOSPITAL_COMMUNITY)
Admission: AD | Admit: 2014-02-13 | Discharge: 2014-02-13 | Disposition: A | Discharge status: Home / Self Care | Payer: Medicaid Other | Source: Ambulatory Visit | Attending: Obstetrics and Gynecology | Admitting: Obstetrics and Gynecology

## 2014-02-13 DIAGNOSIS — O034 Incomplete spontaneous abortion without complication: Secondary | ICD-10-CM

## 2014-02-13 HISTORY — PX: DILATION AND EVACUATION: SHX1459

## 2014-02-13 LAB — CBC
HCT: 38.5 % (ref 36.0–46.0)
HCT: 28.1 % — ABNORMAL LOW (ref 36.0–46.0)
HEMATOCRIT: 37.2 % (ref 36.0–46.0)
HEMOGLOBIN: 13.1 g/dL (ref 12.0–15.0)
HEMOGLOBIN: 12.6 g/dL (ref 12.0–15.0)
Hemoglobin: 9.4 g/dL — ABNORMAL LOW (ref 12.0–15.0)
MCH: 29.6 pg (ref 26.0–34.0)
MCH: 29.4 pg (ref 26.0–34.0)
MCH: 29.2 pg (ref 26.0–34.0)
MCHC: 34 g/dL (ref 30.0–36.0)
MCHC: 33.9 g/dL (ref 30.0–36.0)
MCHC: 33.5 g/dL (ref 30.0–36.0)
MCV: 86.9 fL (ref 78.0–100.0)
MCV: 86.9 fL (ref 78.0–100.0)
MCV: 87.3 fL (ref 78.0–100.0)
PLATELETS: 284 10*3/uL (ref 150–400)
Platelets: 335 10*3/uL (ref 150–400)
Platelets: 255 10*3/uL (ref 150–400)
RBC: 4.43 MIL/uL (ref 3.87–5.11)
RBC: 4.28 MIL/uL (ref 3.87–5.11)
RBC: 3.22 MIL/uL — ABNORMAL LOW (ref 3.87–5.11)
RDW: 14 % (ref 11.5–15.5)
RDW: 14 % (ref 11.5–15.5)
RDW: 13.9 % (ref 11.5–15.5)
WBC: 9.9 10*3/uL (ref 4.0–10.5)
WBC: 17.4 10*3/uL — AB (ref 4.0–10.5)
WBC: 10.6 10*3/uL — ABNORMAL HIGH (ref 4.0–10.5)

## 2014-02-13 LAB — HCG, QUANTITATIVE, PREGNANCY: HCG, BETA CHAIN, QUANT, S: 4185 m[IU]/mL — AB (ref <5)

## 2014-02-13 SURGERY — DILATION AND EVACUATION, UTERUS
Anesthesia: Monitor Anesthesia Care

## 2014-02-13 MED ORDER — ONDANSETRON HCL 4 MG/2ML IJ SOLN
INTRAMUSCULAR | Status: AC
  Filled 2014-02-13: qty 2

## 2014-02-13 MED ORDER — LIDOCAINE HCL 1 % IJ SOLN
INTRAMUSCULAR | Status: AC
Start: 2014-02-13 — End: 2014-02-13
  Filled 2014-02-13: qty 20

## 2014-02-13 MED ORDER — LACTATED RINGERS IV SOLN
INTRAVENOUS | Status: DC | PRN
  Administered 2014-02-13 (×3): via INTRAVENOUS

## 2014-02-13 MED ORDER — KETOROLAC TROMETHAMINE 30 MG/ML IJ SOLN: 15.0000 mg | Freq: Once | INTRAMUSCULAR | Status: DC | PRN

## 2014-02-13 MED ORDER — DEXAMETHASONE SODIUM PHOSPHATE 10 MG/ML IJ SOLN
INTRAMUSCULAR | Status: AC
  Filled 2014-02-13: qty 1

## 2014-02-13 MED ORDER — PROPOFOL 10 MG/ML IV EMUL
INTRAVENOUS | Status: AC
  Filled 2014-02-13: qty 40

## 2014-02-13 MED ORDER — FENTANYL CITRATE 0.05 MG/ML IJ SOLN
INTRAMUSCULAR | Status: DC | PRN
  Administered 2014-02-13 (×2): 50 ug via INTRAVENOUS

## 2014-02-13 MED ORDER — HYDROCODONE-ACETAMINOPHEN 5-325 MG PO TABS
2.0000 | ORAL_TABLET | Freq: Once | ORAL | Status: AC
Start: 2014-02-13 — End: 2014-02-13
  Administered 2014-02-13: 2 via ORAL
  Filled 2014-02-13: qty 2

## 2014-02-13 MED ORDER — DEXAMETHASONE SODIUM PHOSPHATE 10 MG/ML IJ SOLN
INTRAMUSCULAR | Status: DC | PRN
  Administered 2014-02-13: 4 mg via INTRAVENOUS

## 2014-02-13 MED ORDER — MIDAZOLAM HCL 2 MG/2ML IJ SOLN
INTRAMUSCULAR | Status: DC | PRN
  Administered 2014-02-13: 2 mg via INTRAVENOUS

## 2014-02-13 MED ORDER — SILVER NITRATE-POT NITRATE 75-25 % EX MISC
CUTANEOUS | Status: DC | PRN
  Administered 2014-02-13: 1

## 2014-02-13 MED ORDER — SILVER NITRATE-POT NITRATE 75-25 % EX MISC
CUTANEOUS | Status: AC
  Filled 2014-02-13: qty 1

## 2014-02-13 MED ORDER — MIDAZOLAM HCL 2 MG/2ML IJ SOLN
INTRAMUSCULAR | Status: AC
  Filled 2014-02-13: qty 2

## 2014-02-13 MED ORDER — FENTANYL CITRATE 0.05 MG/ML IJ SOLN
INTRAMUSCULAR | Status: AC
  Filled 2014-02-13: qty 2

## 2014-02-13 MED ORDER — FENTANYL CITRATE 0.05 MG/ML IJ SOLN: 25.0000 ug | INTRAMUSCULAR | Status: DC | PRN

## 2014-02-13 MED ORDER — ONDANSETRON HCL 4 MG/2ML IJ SOLN
4.0000 mg | Freq: Once | INTRAMUSCULAR | Status: AC
  Administered 2014-02-13: 4 mg via INTRAVENOUS
  Filled 2014-02-13: qty 2

## 2014-02-13 MED ORDER — ONDANSETRON HCL 4 MG/2ML IJ SOLN: 4.0000 mg | Freq: Once | INTRAMUSCULAR | Status: DC | PRN

## 2014-02-13 MED ORDER — KETOROLAC TROMETHAMINE 30 MG/ML IJ SOLN
INTRAMUSCULAR | Status: AC
  Filled 2014-02-13: qty 1

## 2014-02-13 MED ORDER — CITRIC ACID-SODIUM CITRATE 334-500 MG/5ML PO SOLN
30.0000 mL | Freq: Once | ORAL | Status: AC
  Administered 2014-02-13: 30 mL via ORAL
  Filled 2014-02-13: qty 15

## 2014-02-13 MED ORDER — LIDOCAINE HCL (CARDIAC) 20 MG/ML IV SOLN
INTRAVENOUS | Status: DC | PRN
  Administered 2014-02-13: 10 mg via INTRAVENOUS
  Administered 2014-02-13: 20 mg via INTRAVENOUS

## 2014-02-13 MED ORDER — PROPOFOL INFUSION 10 MG/ML OPTIME
INTRAVENOUS | Status: DC | PRN
  Administered 2014-02-13: 120 ug/kg/min via INTRAVENOUS

## 2014-02-13 MED ORDER — MEPERIDINE HCL 25 MG/ML IJ SOLN: 6.2500 mg | INTRAMUSCULAR | Status: DC | PRN

## 2014-02-13 MED ORDER — LIDOCAINE HCL (CARDIAC) 20 MG/ML IV SOLN
INTRAVENOUS | Status: AC
  Filled 2014-02-13: qty 5

## 2014-02-13 MED ORDER — KETOROLAC TROMETHAMINE 30 MG/ML IJ SOLN
INTRAMUSCULAR | Status: DC | PRN
Start: 2014-02-13 — End: 2014-02-13
  Administered 2014-02-13: 30 mg via INTRAVENOUS

## 2014-02-13 MED ORDER — LACTATED RINGERS IV BOLUS (SEPSIS)
1000.0000 mL | Freq: Once | INTRAVENOUS | Status: AC
  Administered 2014-02-13: 1000 mL via INTRAVENOUS

## 2014-02-13 MED ORDER — PROPOFOL 10 MG/ML IV EMUL
INTRAVENOUS | Status: DC | PRN
  Administered 2014-02-13: 20 mg via INTRAVENOUS
  Administered 2014-02-13: 30 mg via INTRAVENOUS

## 2014-02-13 MED ORDER — FAMOTIDINE IN NACL 20-0.9 MG/50ML-% IV SOLN
20.0000 mg | Freq: Once | INTRAVENOUS | Status: AC
  Administered 2014-02-13: 20 mg via INTRAVENOUS
  Filled 2014-02-13: qty 50

## 2014-02-13 MED ORDER — LIDOCAINE HCL 1 % IJ SOLN
INTRAMUSCULAR | Status: DC | PRN
  Administered 2014-02-13: 20 mL

## 2014-02-13 MED ORDER — ONDANSETRON HCL 4 MG/2ML IJ SOLN
INTRAMUSCULAR | Status: DC | PRN
  Administered 2014-02-13: 4 mg via INTRAVENOUS

## 2014-02-13 SURGICAL SUPPLY — 19 items
CATH ROBINSON RED A/P 16FR (CATHETERS) ×3 IMPLANT
CLOTH BEACON ORANGE TIMEOUT ST (SAFETY) ×3 IMPLANT
DECANTER SPIKE VIAL GLASS SM (MISCELLANEOUS) ×3 IMPLANT
GLOVE BIO SURGEON STRL SZ 6.5 (GLOVE) ×2 IMPLANT
GLOVE BIO SURGEONS STRL SZ 6.5 (GLOVE) ×1
GOWN STRL REUS W/TWL LRG LVL3 (GOWN DISPOSABLE) ×6 IMPLANT
KIT BERKELEY 1ST TRIMESTER 3/8 (MISCELLANEOUS) ×3 IMPLANT
NEEDLE SPNL 22GX3.5 QUINCKE BK (NEEDLE) ×3 IMPLANT
NS IRRIG 1000ML POUR BTL (IV SOLUTION) ×3 IMPLANT
PACK VAGINAL MINOR WOMEN LF (CUSTOM PROCEDURE TRAY) ×3 IMPLANT
PAD OB MATERNITY 4.3X12.25 (PERSONAL CARE ITEMS) ×3 IMPLANT
PAD PREP 24X48 CUFFED NSTRL (MISCELLANEOUS) ×3 IMPLANT
SET BERKELEY SUCTION TUBING (SUCTIONS) ×3 IMPLANT
SYR CONTROL 10ML LL (SYRINGE) ×3 IMPLANT
TOWEL OR 17X24 6PK STRL BLUE (TOWEL DISPOSABLE) ×6 IMPLANT
VACURETTE 10 RIGID CVD (CANNULA) IMPLANT
VACURETTE 7MM CVD STRL WRAP (CANNULA) ×3 IMPLANT
VACURETTE 8 RIGID CVD (CANNULA) IMPLANT
VACURETTE 9 RIGID CVD (CANNULA) IMPLANT

## 2014-02-13 NOTE — MAU Note (Signed)
[facility] RN called to find possible bed for pt to wait for surgery. Will call back with available options

## 2014-02-13 NOTE — Brief Op Note (Signed)
02/13/2014  5:31 PM  PATIENT:  Christine Blair  28 y.o. female  PRE-OPERATIVE DIAGNOSIS:  missed ab  POST-OPERATIVE DIAGNOSIS:  missed ab  PROCEDURE:  Procedure(s): DILATATION AND EVACUATION (N/A)  SURGEON:  Surgeon(s) and Role:    * Oliver PilaKathy W Eulalia Ellerman, MD - Primary   ANESTHESIA:   IV sedation  EBL:  Total I/O In: 800 [I.V.:800] Out: 230 [Urine:30; Blood:200]  BLOOD ADMINISTERED:none  DRAINS: none   LOCAL MEDICATIONS USED:  LIDOCAINE   SPECIMEN: POC's  DISPOSITION OF SPECIMEN:  PATHOLOGY  COUNTS:  YES  TOURNIQUET:  * No tourniquets in log *  DICTATION: .Dragon Dictation  PLAN OF CARE: Discharge to home after PACU  PATIENT DISPOSITION:  PACU - guarded condition.

## 2014-02-13 NOTE — Transfer of Care (Signed)
Immediate Anesthesia Transfer of Care Note  Patient: Christine Blair  Procedure(s) Performed: Procedure(s): DILATATION AND EVACUATION (N/A)  Patient Location: PACU  Anesthesia Type:MAC  Level of Consciousness: awake, alert , oriented and patient cooperative  Airway & Oxygen Therapy: Patient Spontanous Breathing  Post-op Assessment: Report given to PACU RN and Post -op Vital signs reviewed and stable  Post vital signs: Reviewed and stable  Complications: No apparent anesthesia complications

## 2014-02-13 NOTE — Discharge Instructions (Signed)
DISCHARGE INSTRUCTIONS: D&E The following instructions have been prepared to help you care for yourself upon your return home.  MAY TAKE IBUPROFEN (MOTRIN, ADVIL) OR ALEVE AFTER 11:15 PM!!!   Personal hygiene:  Use sanitary pads for vaginal drainage, not tampons.  Shower the day after your procedure.  NO tub baths, pools or Jacuzzis for 2-3 weeks.  Wipe front to back after using the bathroom.  Activity and limitations:  Do NOT drive or operate any equipment for 24 hours. The effects of anesthesia are still present and drowsiness may result.  Do NOT rest in bed all day.  Walking is encouraged.  Walk up and down stairs slowly.  You may resume your normal activity in one to two days or as indicated by your physician.  Sexual activity: NO intercourse for at least 2 weeks after the procedure, or as indicated by your physician.  Diet: Eat a light meal as desired this evening. You may resume your usual diet tomorrow.  What to expect after your surgery: Expect to have vaginal bleeding/discharge for 2-3 days and spotting for up to 10 days. It is not unusual to have soreness for up to 1-2 weeks. You may have a slight burning sensation when you urinate for the first day. Mild cramps may continue for a couple of days. You may have a regular period in 2-6 weeks.  Call your doctor for any of the following:  Excessive vaginal bleeding, saturating and changing one pad every hour.  Inability to urinate 6 hours after discharge from hospital.  Pain not relieved by pain medication.  Fever of 100.4 F or greater.  Unusual vaginal discharge or odor.   Call for an appointment: In 2 weeks.  Do not return to work until seen by Doctor in 2 weeks and released.   Patients signature: ______________________  Nurses signature ________________________  Support person's signature_______________________

## 2014-02-13 NOTE — Progress Notes (Signed)
Dr. [facility] Requested pt consent and prepared for OR

## 2014-02-13 NOTE — MAU Provider Note (Signed)
History     CSN: [id number]  Arrival date and time: 02/13/14 4098   First Provider Initiated Contact with Patient 02/13/14 [phone]      No chief complaint on file.  HPI  [name] is a 28 y.o. female G2P1001 at [redacted]w[redacted]d who present with increased vaginal bleeding and increased pain. She was seen here 3/21 for spotting; [place] was done and it showed an IUP with no yolk sac. Today the bleeding and cramping are similar to her menstrual cycle; pt currently rates her abdominal cramping 10/10 in her suprapubic area.   Quant on 3/21 was 6837.  OB History   Grav Para Term Preterm Abortions TAB SAB Ect Mult Living   2 1 1  0 0 0 0 0 0 1      Past Medical History  Diagnosis Date  . YNWGNFAO(130.8)     Past Surgical History  Procedure Laterality Date  . No past surgeries    . Vaginal delivery  02/26/2012    Procedure: VAGINAL DELIVERY;  Surgeon: Bing Plume, MD;  Location: [facility];  Service: Gynecology;;  Fetal heart rate recovered, surgical procedure not performed     Family History  Problem Relation Age of Onset  . Anesthesia problems Neg Hx   . Diabetes Paternal Grandmother   . Hypertension Paternal Grandmother     History  Substance Use Topics  . Smoking status: Never Smoker   . Smokeless tobacco: Not on file  . Alcohol Use: No    Allergies: No Known Allergies  Prescriptions prior to admission  Medication Sig Dispense Refill  . acetaminophen (TYLENOL) 325 MG tablet Take 650 mg by mouth every 6 (six) hours as needed for mild pain.       . cephALEXin (KEFLEX) 500 MG capsule Take 500 mg by mouth every 6 (six) hours. X 7 days      . ibuprofen (ADVIL,MOTRIN) 600 MG tablet Take 600 mg by mouth every 6 (six) hours. X 7 days      . Prenatal Vit-Fe Fumarate-FA (PRENATAL MULTIVITAMIN) TABS Take 1 tablet by mouth daily.       Results for orders placed during the hospital encounter of 02/13/14 (from the past 48 hour(s))  CBC     Status: None   Collection Time    02/13/14   7:46 AM      Result Value Ref Range   WBC 9.9  4.0 - 10.5 K/uL   RBC 4.43  3.87 - 5.11 MIL/uL   Hemoglobin 13.1  12.0 - 15.0 g/dL   HCT 65.7  84.6 - 96.2 %   MCV 86.9  78.0 - 100.0 fL   MCH 29.6  26.0 - 34.0 pg   MCHC 34.0  30.0 - 36.0 g/dL   RDW 95.2  84.1 - 32.4 %   Platelets 284  150 - 400 K/uL  HCG, QUANTITATIVE, PREGNANCY     Status: Abnormal   Collection Time    02/13/14  7:48 AM      Result Value Ref Range   hCG, Beta Chain, Quant, S 4185 (*) <5 mIU/mL   Comment:              GEST. AGE      CONC.  (mIU/mL)       <=1 WEEK        5 - 50         2 WEEKS       50 - 500  3 WEEKS       100 - 10,000         4 WEEKS     1,000 - 30,000         5 WEEKS     3,500 - 115,000       6-8 WEEKS     12,000 - 270,000        12 WEEKS     15,000 - 220,000                FEMALE AND NON-PREGNANT FEMALE:         LESS THAN 5 mIU/mL  CBC     Status: Abnormal   Collection Time    02/13/14  9:30 AM      Result Value Ref Range   WBC 17.4 (*) 4.0 - 10.5 K/uL   RBC 4.28  3.87 - 5.11 MIL/uL   Hemoglobin 12.6  12.0 - 15.0 g/dL   HCT 40.9  81.1 - 91.4 %   MCV 86.9  78.0 - 100.0 fL   MCH 29.4  26.0 - 34.0 pg   MCHC 33.9  30.0 - 36.0 g/dL   RDW 78.2  95.6 - 21.3 %   Platelets 335  150 - 400 K/uL    US Ob Comp Less 14 Wks  02/11/2014   CLINICAL DATA:  Bleeding, pregnant  EXAM: OBSTETRIC <14 WK [place] AND TRANSVAGINAL OB US  TECHNIQUE: Both transabdominal and transvaginal ultrasound examinations were performed for complete evaluation of the gestation as well as the maternal uterus, adnexal regions, and pelvic cul-de-sac. Transvaginal technique was performed to assess early pregnancy.  COMPARISON:  None.  FINDINGS: Intrauterine gestational sac: Present within uterus, slightly irregular  Yolk sac:  Not identified  Embryo: Not definitely identified ; small irregular focus identified at margin of gestational sac, uncertain if represents fetal pole or debris, 2.8 mm length  Cardiac Activity: N/A  Heart  Rate:  N/A bpm  MSD:  18.3  mm   6 w   6  d       [place] EDC: 09/30/2014  Maternal uterus/adnexae: No subchorionic hemorrhage.  Right ovary normal size and morphology 2.9 x 2.0 x 2.6 cm.  Left ovary normal size and morphology 3.1 x 1.2 x 1.0 cm.  No free pelvic fluid or adnexal masses.  IMPRESSION: Gestational sac is seen within the uterus, slightly irregular, without definite visualization of a yolk sac.  A small peripheral soft tissue nodular component is seen within the gestational sac, potentially could represent a tiny fetal pole or debris.  Unable to establish viability.  Consider followup ultrasound in 10-14 days to reassess.   Electronically Signed   By: Ulyses Southward M.D.   On: 02/11/2014 16:58   US Ob Transvaginal  02/13/2014   CLINICAL DATA:  Miscarriage in progress.  Heavy vaginal bleeding.  EXAM: TRANSVAGINAL OB ULTRASOUND  TECHNIQUE: Transvaginal ultrasound was performed for complete evaluation of the gestation as well as the maternal uterus, adnexal regions, and pelvic cul-de-sac.  COMPARISON:  None.  FINDINGS: Intrauterine gestational sac: There is a large, irregular gestational sac centered in the lower uterine body.  Yolk sac:  None visualized  Embryo: Possible small embryo visualized. Appears similar to ultrasound of 02/11/2014.  Cardiac Activity: Not visualized  MSD: 27.1  mm   8 w   0  d  Maternal uterus/adnexae: Normal ovaries.  No significant free fluid.  IMPRESSION: Large irregular gestational sac centered in the lower uterine body with no  yolk sac visualized. Possible small embryo is unchanged from ultrasound of 02/11/2014. We would expect to see a yolk sac at this time, given the size of the gestational sac. Ultrasound findings in conjunction with clinical history of heavy vaginal bleeding suggest a spontaneous abortion in progress. Consider ultrasound followup as clinically indicated.   Electronically Signed   By: Britta MccreedySusan  Turner M.D.   On: 02/13/2014 13:41   [place] Ob Transvaginal  02/11/2014    CLINICAL DATA:  Bleeding, pregnant  EXAM: OBSTETRIC <14 WK US AND TRANSVAGINAL OB US  TECHNIQUE: Both transabdominal and transvaginal ultrasound examinations were performed for complete evaluation of the gestation as well as the maternal uterus, adnexal regions, and pelvic cul-de-sac. Transvaginal technique was performed to assess early pregnancy.  COMPARISON:  None.  FINDINGS: Intrauterine gestational sac: Present within uterus, slightly irregular  Yolk sac:  Not identified  Embryo: Not definitely identified ; small irregular focus identified at margin of gestational sac, uncertain if represents fetal pole or debris, 2.8 mm length  Cardiac Activity: N/A  Heart Rate:  N/A bpm  MSD:  18.3  mm   6 w   6  d       US EDC: 09/30/2014  Maternal uterus/adnexae: No subchorionic hemorrhage.  Right ovary normal size and morphology 2.9 x 2.0 x 2.6 cm.  Left ovary normal size and morphology 3.1 x 1.2 x 1.0 cm.  No free pelvic fluid or adnexal masses.  IMPRESSION: Gestational sac is seen within the uterus, slightly irregular, without definite visualization of a yolk sac.  A small peripheral soft tissue nodular component is seen within the gestational sac, potentially could represent a tiny fetal pole or debris.  Unable to establish viability.  Consider followup ultrasound in 10-14 days to reassess.   Electronically Signed   By: Ulyses SouthwardMark  Boles M.D.   On: 02/11/2014 16:58      Review of Systems  Constitutional: Negative for fever and chills.  Gastrointestinal: Positive for abdominal pain (+ suprapubic cramping ). Negative for heartburn, nausea, vomiting, diarrhea and constipation.  Genitourinary: Negative for dysuria, urgency, frequency and hematuria.       No vaginal discharge. Vaginal bleeding; heavy like a period  No dysuria.    Physical Exam   Blood pressure 120/60, pulse 87, temperature 98.2 F (36.8 C), temperature source Oral, resp. rate 18, height 5' 1.5" (1.562 m), weight 51.71 kg (114 lb), last menstrual  period 12/08/2013, not currently breastfeeding.  Physical Exam  Constitutional: She appears well-developed and well-nourished. No distress.  GI: Soft. There is tenderness (Suprapubic tenderness ).  Genitourinary:  Speculum exam: Vagina - 10-15 small (Quarter size) clots noted in the vaginal canal. I used ring forceps and several fox swabs to clear the clots in order to visualize the cervix.  Cervix -Moderate active bleeding  Bimanual exam: Cervix FT  Chaperone present for exam.   Skin: She is not diaphoretic. There is pallor.    MAU Course  Procedures None  MDM O Positive  Vicodin 2 tab given in MAU  Consulted with Dr. Senaida Oresichardson  1055: informed Dr. Senaida Oresichardson of patients bleeding, pain status. I plan to monitor patients bleeding for the next 30 mins and inform Dr. Senaida Oresichardson.  1145: Discussed bleeding with Dr. Senaida Oresichardson; pt will go for an US  Pt currently rates her pain 0/10; bleeding minimal on pad; pt up to the bathroom without difficulty.  US report discussed with Dr. Senaida Oresichardson; pt requests D/C; will prep for surgery.   Assessment and Plan  A   incomplete abortion   P:  D&C  Iona Hansen Rasch, NP  02/13/2014, 1:54 PM   Pt seen and findings reviewed.  US shows retained gestational sac with almost no tissue passed yet.  Incomplete abortion discussed with patient and options of expectant management vs proceeding with D&C d/w her.  She wishes to proceed with a D&C.  The procedure and risks/benefits were d/w her in detail.  OR notified and will proceed at 430 pm since bleeding stable currently.

## 2014-02-13 NOTE — MAU Note (Signed)
C/o heavy vaginal bleeding with cramping that started around 0300 this AM; has been having some spotting for past  2-3 days;

## 2014-02-13 NOTE — Anesthesia Preprocedure Evaluation (Signed)
Anesthesia Evaluation  Patient identified by MRN, date of birth, ID band Patient awake    Reviewed: Allergy & Precautions, H&P , NPO status , Patient's Chart, lab work & pertinent test results  Airway Mallampati: I TM Distance: >3 FB Neck ROM: full    Dental no notable dental hx. (+) Teeth Intact   Pulmonary neg pulmonary ROS,    Pulmonary exam normal       Cardiovascular negative cardio ROS      Neuro/Psych negative psych ROS   GI/Hepatic negative GI ROS, Neg liver ROS,   Endo/Other  negative endocrine ROS  Renal/GU negative Renal ROS     Musculoskeletal   Abdominal Normal abdominal exam  (+)   Peds  Hematology negative hematology ROS (+)   Anesthesia Other Findings   Reproductive/Obstetrics negative OB ROS                           Anesthesia Physical Anesthesia Plan  ASA: II  Anesthesia Plan: MAC   Post-op Pain Management:    Induction: Intravenous  Airway Management Planned:   Additional Equipment:   Intra-op Plan:   Post-operative Plan:   Informed Consent: I have reviewed the patients History and Physical, chart, labs and discussed the procedure including the risks, benefits and alternatives for the proposed anesthesia with the patient or authorized representative who has indicated his/her understanding and acceptance.     Plan Discussed with: CRNA and Surgeon  Anesthesia Plan Comments:         Anesthesia Quick Evaluation

## 2014-02-13 NOTE — Anesthesia Postprocedure Evaluation (Signed)
Anesthesia Post Note  Patient: Christine Blair  Procedure(s) Performed: Procedure(s) (LRB): DILATATION AND EVACUATION (N/A)  Anesthesia type: MAC  Patient location: PACU  Post pain: Pain level controlled  Post assessment: Post-op Vital signs reviewed  Last Vitals:  Filed Vitals:   02/13/14 1614  BP: 127/51  Pulse: 101  Temp: 36.4 C  Resp: 16    Post vital signs: Reviewed  Level of consciousness: sedated  Complications: No apparent anesthesia complications

## 2014-02-13 NOTE — Op Note (Signed)
Operative note  Preoperative diagnosis Incomplete abortion  Postoperative diagnosis Same  Procedure Suction dilation and evacuation  Surgeon Dr. Huel CoteKathy Marguarite Markov  Anesthesia LMA and paracervical block  Fluids Estimated blood loss 200 cc Urine output approximately 30 cc prior procedure IV fluids 800 cc LR  Findings There was a large amount of clot evacuated from the patient's vagina and some stringy tissue was removed from the external cervical os. The cervix was noted to already be a fingertip dilated. Once the clot was cleared from the vagina the uterus sounded to approximately 7-8 cm. A moderate amount of products of conception were obtained.  Specimen POC sent to pathology  Procedure Patient was taken to the operating room where sedation anesthesia was obtained without difficulty. The patient was then prepped and draped in the normal sterile fashion in the dorsal lithotomy position. An appropriate time out was performed. A speculum was placed in the patient's vagina and at this point a large amount of clot noted in the vagina and evacuated with a ring forcep. It is estimated approximately 200 cc of clot was removed. The uterine sound was then easily introduced into a dilated cervix and the uterus sounded approximately 7 cm. 7 mm suction curette was then obtained and introduced into the uterine fundus. In several passes a moderate amount of products of conception were obtained. When no further tissue was forthcoming the suction was discontinued and a sharp curettage performed of the uterine cavity with no obvious remaining tissue felt. 2 additional passes were performed and no further tissue obtained. At this point there is no active bleeding so all instruments removed from the cervix. The tenaculum site was treated with silver nitrate for hemostasis. The speculum was removed and the patient was taken to the recovery room in good condition.

## 2014-02-13 NOTE — H&P (Signed)
No chief complaint on file.   HPI  [name] is a 28 y.o. female G2P1001 at [redacted]w[redacted]d who present with increased vaginal bleeding and increased pain. She was seen here 3/21 for spotting; [place] was done and it showed an IUP with no yolk sac. Today the bleeding and cramping are similar to her menstrual cycle; pt currently rates her abdominal cramping 10/10 in her suprapubic area.  Quant on 3/21 was 6837.  OB History    Grav  Para  Term  Preterm  Abortions  TAB  SAB  Ect  Mult  Living    2  1  1   0  0  0  0  0  0  1      Past Medical History   Diagnosis  Date   .  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  No past surgeries     .  Vaginal delivery   02/26/2012     Procedure: VAGINAL DELIVERY; Surgeon: Bing Plume, MD; Location: [facility]; Service: Gynecology;; Fetal heart rate recovered, surgical procedure not performed    Family History   Problem  Relation  Age of Onset   .  Anesthesia problems  Neg Hx    .  Diabetes  Paternal Grandmother    .  Hypertension  Paternal Grandmother     History   Substance Use Topics   .  Smoking status:  Never Smoker   .  Smokeless tobacco:  Not on file   .  Alcohol Use:  No    Allergies: No Known Allergies  Prescriptions prior to admission   Medication  Sig  Dispense  Refill   .  acetaminophen (TYLENOL) 325 MG tablet  Take 650 mg by mouth every 6 (six) hours as needed for mild pain.     .  cephALEXin (KEFLEX) 500 MG capsule  Take 500 mg by mouth every 6 (six) hours. X 7 days     .  ibuprofen (ADVIL,MOTRIN) 600 MG tablet  Take 600 mg by mouth every 6 (six) hours. X 7 days     .  Prenatal Vit-Fe Fumarate-FA (PRENATAL MULTIVITAMIN) TABS  Take 1 tablet by mouth daily.      Results for orders placed during the hospital encounter of 02/13/14 (from the past 48 hour(s))   CBC Status: None    Collection Time    02/13/14 7:46 AM   Result  Value  Ref Range    WBC  9.9  4.0 - 10.5 K/uL    RBC  4.43  3.87 - 5.11  MIL/uL    Hemoglobin  13.1  12.0 - 15.0 g/dL    HCT  81.1  91.4 - 78.2 %    MCV  86.9  78.0 - 100.0 fL    MCH  29.6  26.0 - 34.0 pg    MCHC  34.0  30.0 - 36.0 g/dL    RDW  95.6  21.3 - 08.6 %    Platelets  284  150 - 400 K/uL   HCG, QUANTITATIVE, PREGNANCY Status: Abnormal    Collection Time    02/13/14 7:48 AM   Result  Value  Ref Range    hCG, Beta Chain, Quant, S  4185 (*)  <5 mIU/mL    Comment:      GEST. AGE CONC. (mIU/mL)     <=1 WEEK 5 - 50     2  WEEKS 50 - 500     3 WEEKS 100 - 10,000     4 WEEKS 1,000 - 30,000     5 WEEKS 3,500 - 115,000     6-8 WEEKS 12,000 - 270,000     12 WEEKS 15,000 - 220,000         FEMALE AND NON-PREGNANT FEMALE:     LESS THAN 5 mIU/mL   CBC Status: Abnormal    Collection Time    02/13/14 9:30 AM   Result  Value  Ref Range    WBC  17.4 (*)  4.0 - 10.5 K/uL    RBC  4.28  3.87 - 5.11 MIL/uL    Hemoglobin  12.6  12.0 - 15.0 g/dL    HCT  45.4  09.8 - 11.9 %    MCV  86.9  78.0 - 100.0 fL    MCH  29.4  26.0 - 34.0 pg    MCHC  33.9  30.0 - 36.0 g/dL    RDW  14.7  82.9 - 56.2 %    Platelets  335  150 - 400 K/uL    US Ob Comp Less 14 Wks  02/11/2014 CLINICAL DATA: Bleeding, pregnant EXAM: OBSTETRIC <14 WK [place] AND TRANSVAGINAL OB US TECHNIQUE: Both transabdominal and transvaginal ultrasound examinations were performed for complete evaluation of the gestation as well as the maternal uterus, adnexal regions, and pelvic cul-de-sac. Transvaginal technique was performed to assess early pregnancy. COMPARISON: None. FINDINGS: Intrauterine gestational sac: Present within uterus, slightly irregular Yolk sac: Not identified Embryo: Not definitely identified ; small irregular focus identified at margin of gestational sac, uncertain if represents fetal pole or debris, 2.8 mm length Cardiac Activity: N/A Heart Rate: N/A bpm MSD: 18.3 mm 6 w 6 d [place] EDC: 09/30/2014 Maternal uterus/adnexae: No subchorionic hemorrhage. Right ovary normal size and morphology 2.9 x 2.0 x 2.6  cm. Left ovary normal size and morphology 3.1 x 1.2 x 1.0 cm. No free pelvic fluid or adnexal masses. IMPRESSION: Gestational sac is seen within the uterus, slightly irregular, without definite visualization of a yolk sac. A small peripheral soft tissue nodular component is seen within the gestational sac, potentially could represent a tiny fetal pole or debris. Unable to establish viability. Consider followup ultrasound in 10-14 days to reassess. Electronically Signed By: Ulyses Southward M.D. On: 02/11/2014 16:58  US Ob Transvaginal  02/13/2014 CLINICAL DATA: Miscarriage in progress. Heavy vaginal bleeding. EXAM: TRANSVAGINAL OB ULTRASOUND TECHNIQUE: Transvaginal ultrasound was performed for complete evaluation of the gestation as well as the maternal uterus, adnexal regions, and pelvic cul-de-sac. COMPARISON: None. FINDINGS: Intrauterine gestational sac: There is a large, irregular gestational sac centered in the lower uterine body. Yolk sac: None visualized Embryo: Possible small embryo visualized. Appears similar to ultrasound of 02/11/2014. Cardiac Activity: Not visualized MSD: 27.1 mm 8 w 0 d Maternal uterus/adnexae: Normal ovaries. No significant free fluid. IMPRESSION: Large irregular gestational sac centered in the lower uterine body with no yolk sac visualized. Possible small embryo is unchanged from ultrasound of 02/11/2014. We would expect to see a yolk sac at this time, given the size of the gestational sac. Ultrasound findings in conjunction with clinical history of heavy vaginal bleeding suggest a spontaneous abortion in progress. Consider ultrasound followup as clinically indicated. Electronically Signed By: Britta Mccreedy M.D. On: 02/13/2014 13:41  US Ob Transvaginal  02/11/2014 CLINICAL DATA: Bleeding, pregnant EXAM: OBSTETRIC <14 WK [place] AND TRANSVAGINAL OB US TECHNIQUE: Both transabdominal and transvaginal ultrasound examinations were performed  for complete evaluation of the gestation as well as the  maternal uterus, adnexal regions, and pelvic cul-de-sac. Transvaginal technique was performed to assess early pregnancy. COMPARISON: None. FINDINGS: Intrauterine gestational sac: Present within uterus, slightly irregular Yolk sac: Not identified Embryo: Not definitely identified ; small irregular focus identified at margin of gestational sac, uncertain if represents fetal pole or debris, 2.8 mm length Cardiac Activity: N/A Heart Rate: N/A bpm MSD: 18.3 mm 6 w 6 d US EDC: 09/30/2014 Maternal uterus/adnexae: No subchorionic hemorrhage. Right ovary normal size and morphology 2.9 x 2.0 x 2.6 cm. Left ovary normal size and morphology 3.1 x 1.2 x 1.0 cm. No free pelvic fluid or adnexal masses. IMPRESSION: Gestational sac is seen within the uterus, slightly irregular, without definite visualization of a yolk sac. A small peripheral soft tissue nodular component is seen within the gestational sac, potentially could represent a tiny fetal pole or debris. Unable to establish viability. Consider followup ultrasound in 10-14 days to reassess. Electronically Signed By: Ulyses SouthwardMark Boles M.D. On: 02/11/2014 16:58   Review of Systems  Constitutional: Negative for fever and chills.  Gastrointestinal: Positive for abdominal pain (+ suprapubic cramping ). Negative for heartburn, nausea, vomiting, diarrhea and constipation.  Genitourinary: Negative for dysuria, urgency, frequency and hematuria.  No vaginal discharge.  Vaginal bleeding; heavy like a period  No dysuria.   Physical Exam   Blood pressure 120/60, pulse 87, temperature 98.2 F (36.8 C), temperature source Oral, resp. rate 18, height 5' 1.5" (1.562 m), weight 51.71 kg (114 lb), last menstrual period 12/08/2013, not currently breastfeeding.  Physical Exam  Constitutional: She appears well-developed and well-nourished. No distress.  GI: Soft. There is tenderness (Suprapubic tenderness ).  Genitourinary:  Speculum exam: Vagina - 10-15 small (Quarter size) clots noted  in the vaginal canal. I used ring forceps and several fox swabs to clear the clots in order to visualize the cervix.  Cervix -Moderate active bleeding  Bimanual exam: Cervix FT  Chaperone present for exam. Skin: She is not diaphoretic. There is pallor.   MAU Course   Procedures  None  MDM  O Positive  Vicodin 2 tab given in MAU  Consulted with Dr. Senaida Oresichardson  1055: informed Dr. Senaida Oresichardson of patients bleeding, pain status. I plan to monitor patients bleeding for the next 30 mins and inform Dr. Senaida Oresichardson.  1145: Discussed bleeding with Dr. Senaida Oresichardson; pt will go for an US  Pt currently rates her pain 0/10; bleeding minimal on pad; pt up to the bathroom without difficulty.  US report discussed with Dr. Senaida Oresichardson; pt requests D/C; will prep for surgery.  Assessment and Plan   A incomplete abortion  P: D&C  Iona HansenJennifer Irene Rasch, NP  02/13/2014, 1:54 PM  Pt seen and findings reviewed. US shows retained gestational sac with almost no tissue passed yet. Incomplete abortion discussed with patient and options of expectant management vs proceeding with D&C d/w her. She wishes to proceed with a D&C. The procedure and risks/benefits were d/w her in detail. OR notified and will proceed at 430 pm since bleeding stable currently.  Revision History.Marland Kitchen..Marland Kitchen

## 2014-02-14 ENCOUNTER — Encounter (HOSPITAL_COMMUNITY): Payer: Self-pay | Admitting: Obstetrics and Gynecology

## 2014-05-16 ENCOUNTER — Encounter (HOSPITAL_COMMUNITY): Payer: Self-pay

## 2014-09-25 ENCOUNTER — Encounter (HOSPITAL_COMMUNITY): Payer: Self-pay

## 2014-12-07 ENCOUNTER — Encounter (HOSPITAL_COMMUNITY): Payer: Self-pay | Admitting: Obstetrics and Gynecology

## 2014-12-17 ENCOUNTER — Encounter (HOSPITAL_COMMUNITY): Payer: Self-pay | Admitting: *Deleted

## 2015-05-03 ENCOUNTER — Encounter (HOSPITAL_COMMUNITY): Payer: Self-pay | Admitting: Obstetrics and Gynecology

## 2015-07-05 IMAGING — US US OB TRANSVAGINAL
1 series · 14 of 28 positions shown · non-contrast
Comparison: None.

CLINICAL DATA: Miscarriage in progress.  Heavy vaginal bleeding.

EXAM:
TRANSVAGINAL OB ULTRASOUND
TECHNIQUE: Transvaginal ultrasound was performed for complete evaluation of the
gestation as well as the maternal uterus, adnexal regions, and
pelvic cul-de-sac.

[Series 1: us ob transvaginal · 42 acquisitions, 14 frames shown]
[im 2/42]
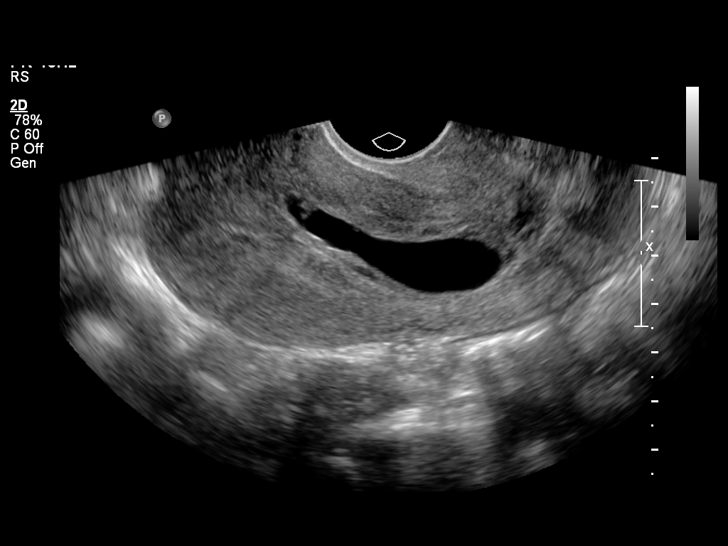
[im 5/42]
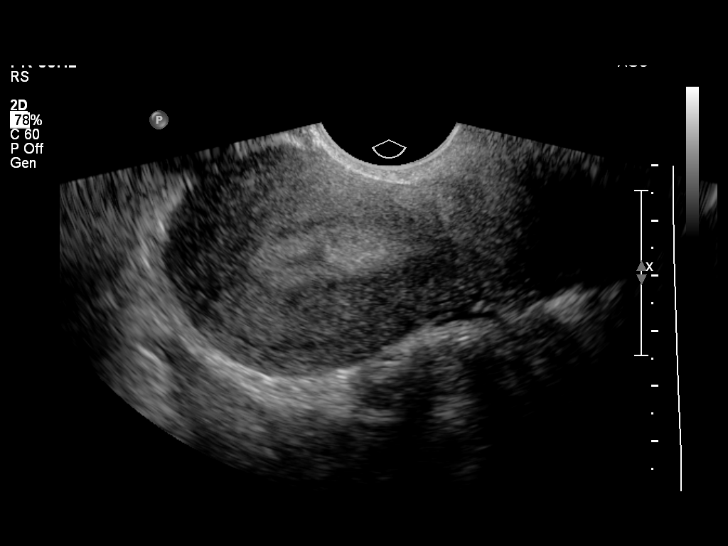
[im 8/42]
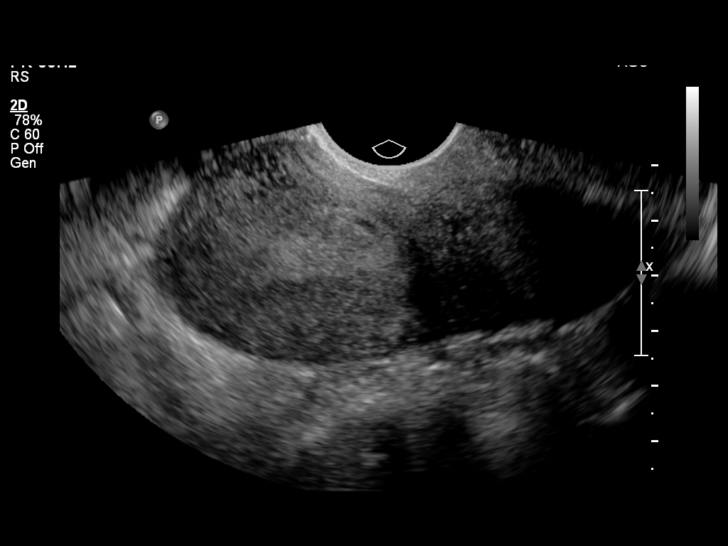
[im 11/42]
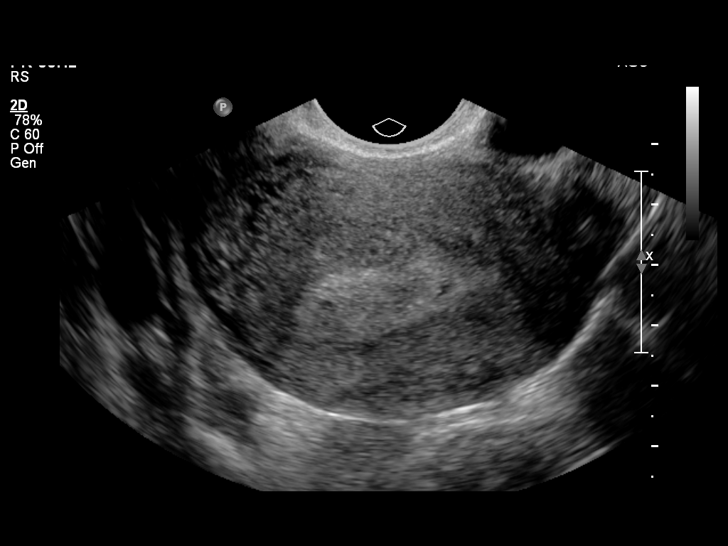
[im 14/42]
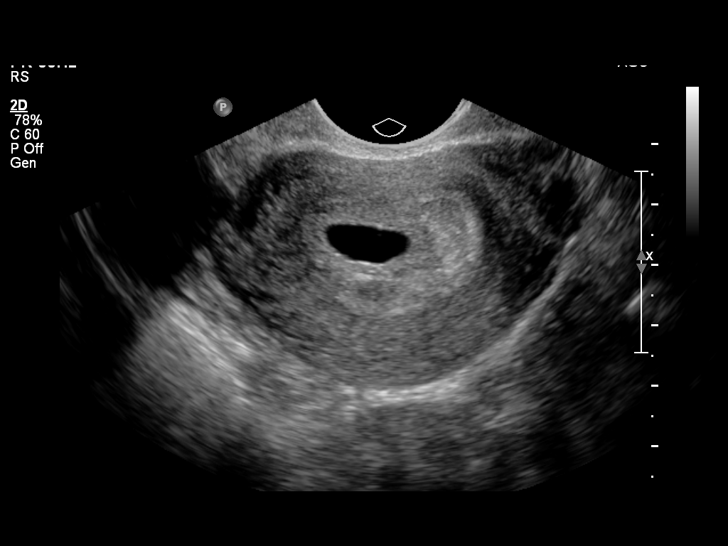
[im 17/42]
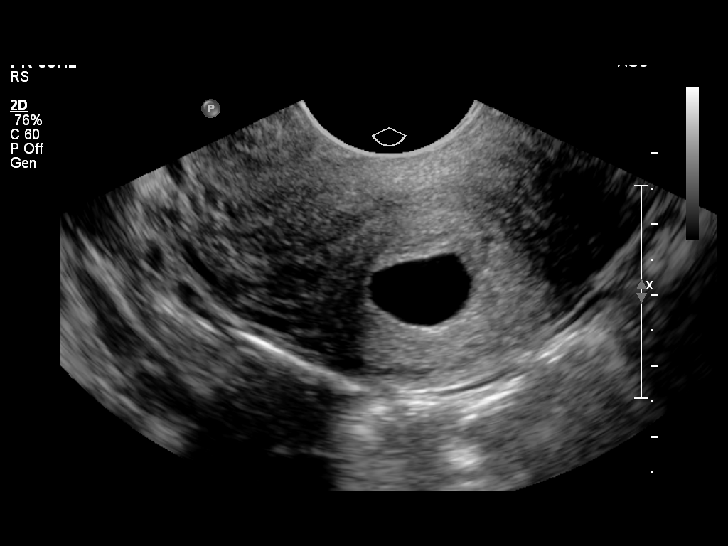
[im 20/42]
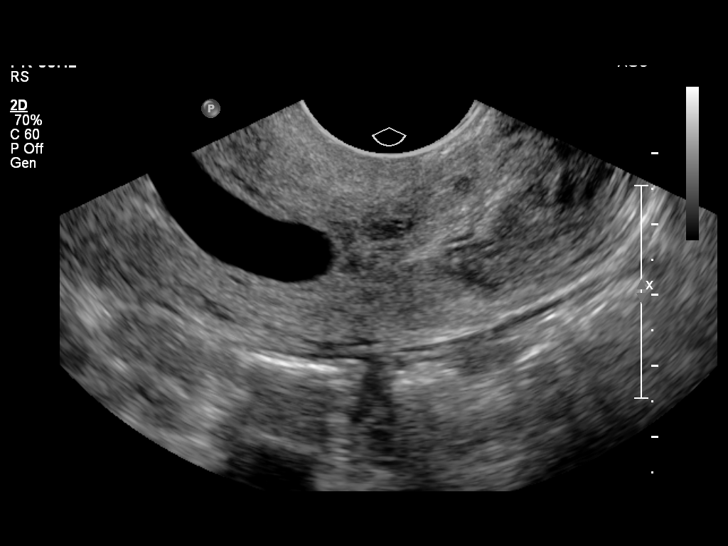
[im 23/42]
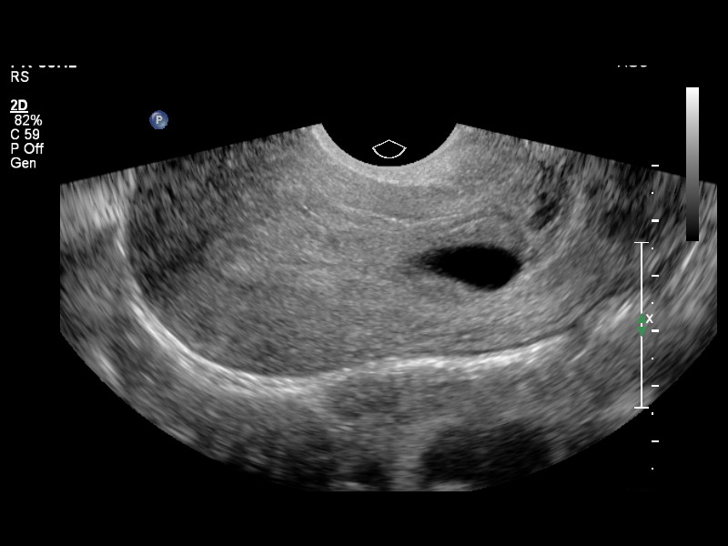
[im 26/42]
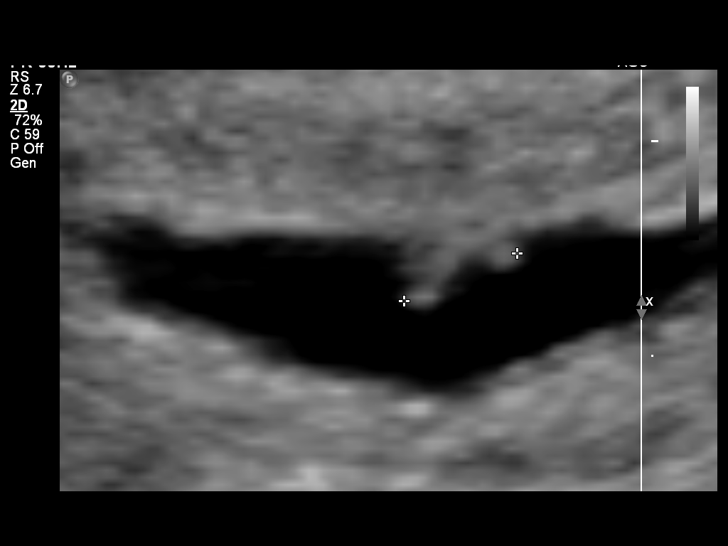
[im 29/42]
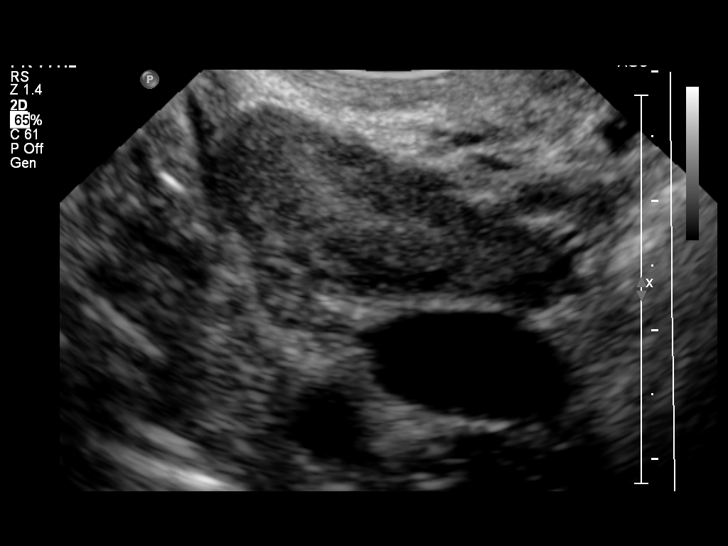
[im 32/42]
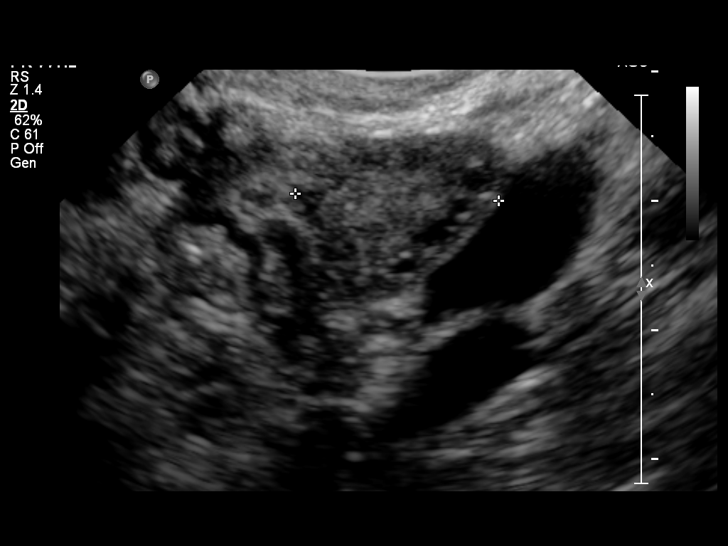
[im 35/42]
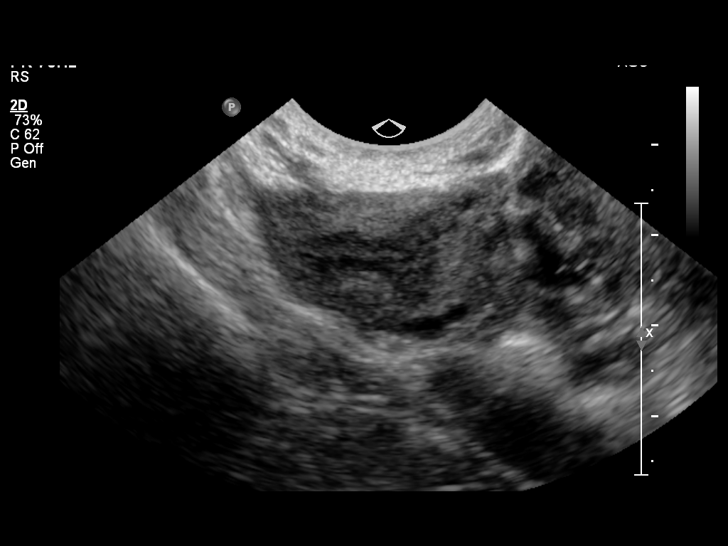
[im 38/42]
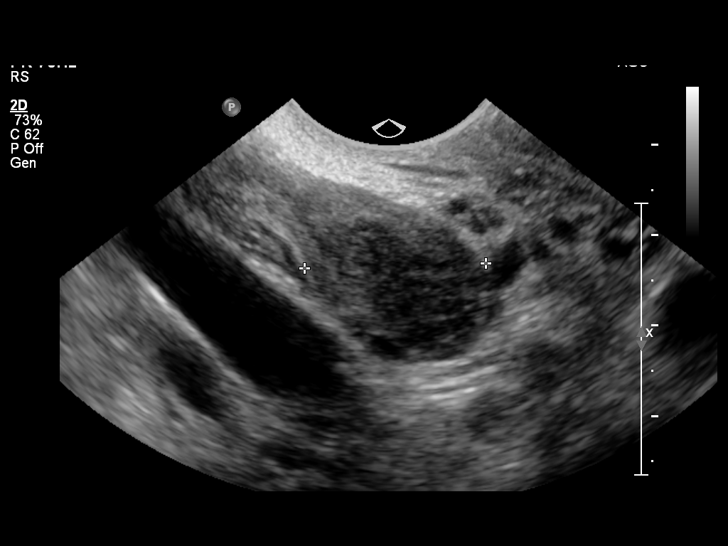
[im 42/42]
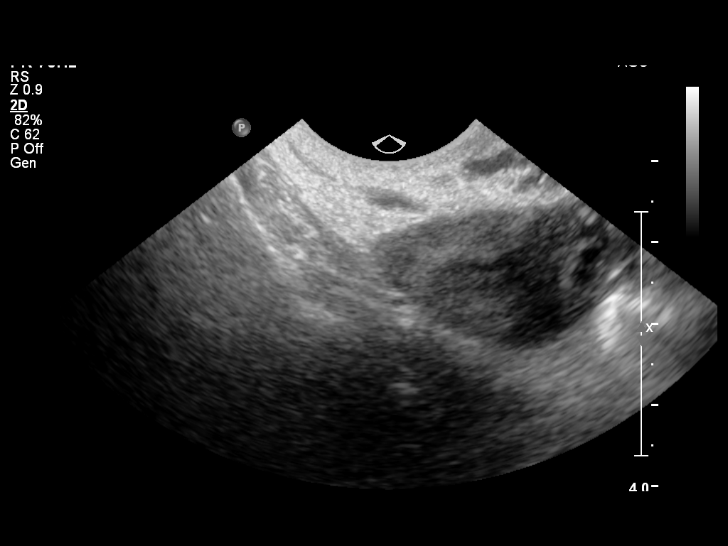

[14 of 28 positions shown; findings below may reference images not displayed]

FINDINGS: Intrauterine gestational sac: There is a large, irregular
gestational sac centered in the lower uterine body.

Yolk sac:  None visualized

Embryo: Possible small embryo visualized. Appears similar to
ultrasound of 02/11/2014.

Cardiac Activity: Not visualized

MSD: 27.1  mm   8 w   0  d

Maternal uterus/adnexae: Normal ovaries.  No significant free fluid.
IMPRESSION: Large irregular gestational sac centered in the lower uterine body
with no yolk sac visualized. Possible small embryo is unchanged from
ultrasound of 02/11/2014. We would expect to see a yolk sac at this
time, given the size of the gestational sac. Ultrasound findings in
conjunction with clinical history of heavy vaginal bleeding suggest
a spontaneous abortion in progress. Consider ultrasound followup as
clinically indicated.

## 2015-07-09 LAB — OB RESULTS CONSOLE RPR: RPR: NONREACTIVE

## 2015-07-09 LAB — OB RESULTS CONSOLE RUBELLA ANTIBODY, IGM: RUBELLA: IMMUNE

## 2015-07-09 LAB — OB RESULTS CONSOLE ANTIBODY SCREEN: ANTIBODY SCREEN: NEGATIVE

## 2015-07-09 LAB — OB RESULTS CONSOLE HIV ANTIBODY (ROUTINE TESTING): HIV: NONREACTIVE

## 2015-07-09 LAB — OB RESULTS CONSOLE ABO/RH: RH TYPE: POSITIVE

## 2015-07-09 LAB — OB RESULTS CONSOLE HEPATITIS B SURFACE ANTIGEN: Hepatitis B Surface Ag: NEGATIVE

## 2015-09-07 ENCOUNTER — Other Ambulatory Visit (HOSPITAL_COMMUNITY): Payer: Self-pay | Admitting: Obstetrics and Gynecology

## 2015-09-07 DIAGNOSIS — Z3689 Encounter for other specified antenatal screening: Secondary | ICD-10-CM

## 2015-09-11 ENCOUNTER — Ambulatory Visit (HOSPITAL_COMMUNITY)
Admission: RE | Admit: 2015-09-11 | Discharge: 2015-09-11 | Disposition: A | Discharge status: Home / Self Care | Payer: Medicaid Other | Source: Ambulatory Visit | Attending: Obstetrics and Gynecology | Admitting: Obstetrics and Gynecology

## 2015-09-11 ENCOUNTER — Other Ambulatory Visit (HOSPITAL_COMMUNITY): Payer: Self-pay | Admitting: Obstetrics and Gynecology

## 2015-09-11 ENCOUNTER — Ambulatory Visit (HOSPITAL_COMMUNITY): Admission: RE | Admit: 2015-09-11 | Payer: Medicaid Other | Source: Ambulatory Visit

## 2015-09-11 ENCOUNTER — Encounter (HOSPITAL_COMMUNITY): Payer: Self-pay

## 2015-09-11 DIAGNOSIS — Z36 Encounter for antenatal screening of mother: Secondary | ICD-10-CM | POA: Insufficient documentation

## 2015-09-11 DIAGNOSIS — Z3A20 20 weeks gestation of pregnancy: Secondary | ICD-10-CM

## 2015-09-11 DIAGNOSIS — Z3689 Encounter for other specified antenatal screening: Secondary | ICD-10-CM

## 2015-09-11 DIAGNOSIS — O4402 Placenta previa specified as without hemorrhage, second trimester: Secondary | ICD-10-CM | POA: Diagnosis not present

## 2015-09-11 DIAGNOSIS — IMO0001 Reserved for inherently not codable concepts without codable children: Secondary | ICD-10-CM

## 2015-09-13 ENCOUNTER — Encounter (HOSPITAL_COMMUNITY): Payer: Self-pay | Admitting: Obstetrics and Gynecology

## 2015-09-17 ENCOUNTER — Other Ambulatory Visit (HOSPITAL_COMMUNITY): Payer: Self-pay | Admitting: Obstetrics and Gynecology

## 2015-10-02 ENCOUNTER — Encounter (HOSPITAL_COMMUNITY): Payer: Self-pay

## 2015-10-02 ENCOUNTER — Ambulatory Visit (HOSPITAL_COMMUNITY)
Admission: RE | Admit: 2015-10-02 | Discharge: 2015-10-02 | Disposition: A | Discharge status: Home / Self Care | Payer: Medicaid Other | Source: Ambulatory Visit | Attending: Obstetrics and Gynecology | Admitting: Obstetrics and Gynecology

## 2015-10-02 DIAGNOSIS — IMO0001 Reserved for inherently not codable concepts without codable children: Secondary | ICD-10-CM

## 2015-10-02 DIAGNOSIS — O418X90 Other specified disorders of amniotic fluid and membranes, unspecified trimester, not applicable or unspecified: Secondary | ICD-10-CM | POA: Insufficient documentation

## 2015-10-02 DIAGNOSIS — Z3A23 23 weeks gestation of pregnancy: Secondary | ICD-10-CM | POA: Diagnosis not present

## 2015-10-02 DIAGNOSIS — O4402 Placenta previa specified as without hemorrhage, second trimester: Secondary | ICD-10-CM | POA: Diagnosis not present

## 2015-10-30 ENCOUNTER — Other Ambulatory Visit (HOSPITAL_COMMUNITY): Payer: Self-pay | Admitting: Maternal and Fetal Medicine

## 2015-10-30 ENCOUNTER — Encounter (HOSPITAL_COMMUNITY): Payer: Self-pay

## 2015-10-30 ENCOUNTER — Ambulatory Visit (HOSPITAL_COMMUNITY)
Admission: RE | Admit: 2015-10-30 | Discharge: 2015-10-30 | Disposition: A | Discharge status: Home / Self Care | Payer: Medicaid Other | Source: Ambulatory Visit | Attending: Obstetrics and Gynecology | Admitting: Obstetrics and Gynecology

## 2015-10-30 DIAGNOSIS — O418X20 Other specified disorders of amniotic fluid and membranes, second trimester, not applicable or unspecified: Secondary | ICD-10-CM | POA: Insufficient documentation

## 2015-10-30 DIAGNOSIS — IMO0001 Reserved for inherently not codable concepts without codable children: Secondary | ICD-10-CM

## 2015-10-30 DIAGNOSIS — Z3A27 27 weeks gestation of pregnancy: Secondary | ICD-10-CM

## 2015-11-25 NOTE — L&D Delivery Note (Signed)
Delivery Note Pt reached complete dilation and pushed well.  At 12:56 PM a healthy female was delivered via Vaginal, Spontaneous Delivery (Presentation: Left Occiput Anterior).  APGAR: 8, 8; weight pending .   Placenta status: delivered spontaneously.  Cord:  with the following complications: none.  Anesthesia: Epidural  Episiotomy: None Lacerations:  none Suture Repair: n/a Est. Blood Loss (mL):   Mom to postpartum.  Baby to Couplet care / Skin to Skin. D/w pt circumcision, desires in office  Audrena Talaga W 01/06/2016, 1:10 PM

## 2016-01-06 ENCOUNTER — Encounter (HOSPITAL_COMMUNITY): Payer: Self-pay

## 2016-01-06 ENCOUNTER — Inpatient Hospital Stay (HOSPITAL_COMMUNITY): Payer: Medicaid Other | Admitting: Anesthesiology

## 2016-01-06 ENCOUNTER — Inpatient Hospital Stay (HOSPITAL_COMMUNITY)
Admission: AD | Admit: 2016-01-06 | Discharge: 2016-01-08 | DRG: 775 | Disposition: A | Discharge status: Home / Self Care | Payer: Medicaid Other | Source: Ambulatory Visit | Attending: Obstetrics and Gynecology | Admitting: Obstetrics and Gynecology

## 2016-01-06 DIAGNOSIS — Z3A37 37 weeks gestation of pregnancy: Secondary | ICD-10-CM

## 2016-01-06 DIAGNOSIS — Z833 Family history of diabetes mellitus: Secondary | ICD-10-CM

## 2016-01-06 DIAGNOSIS — Z8249 Family history of ischemic heart disease and other diseases of the circulatory system: Secondary | ICD-10-CM

## 2016-01-06 DIAGNOSIS — O4292 Full-term premature rupture of membranes, unspecified as to length of time between rupture and onset of labor: Secondary | ICD-10-CM | POA: Diagnosis present

## 2016-01-06 LAB — CBC
HCT: 37.9 % (ref 36.0–46.0)
Hemoglobin: 13.1 g/dL (ref 12.0–15.0)
MCH: 30.3 pg (ref 26.0–34.0)
MCHC: 34.6 g/dL (ref 30.0–36.0)
MCV: 87.5 fL (ref 78.0–100.0)
Platelets: 251 10*3/uL (ref 150–400)
RBC: 4.33 MIL/uL (ref 3.87–5.11)
RDW: 13.6 % (ref 11.5–15.5)
WBC: 14.1 10*3/uL — ABNORMAL HIGH (ref 4.0–10.5)

## 2016-01-06 LAB — TYPE AND SCREEN
ABO/RH(D): O POS
ANTIBODY SCREEN: NEGATIVE

## 2016-01-06 MED ORDER — LACTATED RINGERS IV SOLN: 500.0000 mL | INTRAVENOUS | Status: DC | PRN

## 2016-01-06 MED ORDER — ACETAMINOPHEN 325 MG PO TABS: 650.0000 mg | ORAL_TABLET | ORAL | Status: DC | PRN

## 2016-01-06 MED ORDER — WITCH HAZEL-GLYCERIN EX PADS: 1.0000 "application " | MEDICATED_PAD | CUTANEOUS | Status: DC | PRN

## 2016-01-06 MED ORDER — OXYCODONE-ACETAMINOPHEN 5-325 MG PO TABS: 2.0000 | ORAL_TABLET | ORAL | Status: DC | PRN

## 2016-01-06 MED ORDER — OXYTOCIN 10 UNIT/ML IJ SOLN
1.0000 m[IU]/min | INTRAVENOUS | Status: DC
  Administered 2016-01-06: 2 m[IU]/min via INTRAVENOUS

## 2016-01-06 MED ORDER — LIDOCAINE HCL (PF) 1 % IJ SOLN
30.0000 mL | INTRAMUSCULAR | Status: DC | PRN
  Filled 2016-01-06: qty 30

## 2016-01-06 MED ORDER — BENZOCAINE-MENTHOL 20-0.5 % EX AERO
1.0000 "application " | INHALATION_SPRAY | CUTANEOUS | Status: DC | PRN
  Administered 2016-01-06: 1 via TOPICAL
  Filled 2016-01-06: qty 56

## 2016-01-06 MED ORDER — SENNOSIDES-DOCUSATE SODIUM 8.6-50 MG PO TABS
2.0000 | ORAL_TABLET | ORAL | Status: DC
  Administered 2016-01-06 – 2016-01-08 (×2): 2 via ORAL
  Filled 2016-01-06 (×2): qty 2

## 2016-01-06 MED ORDER — DIPHENHYDRAMINE HCL 50 MG/ML IJ SOLN: 12.5000 mg | INTRAMUSCULAR | Status: DC | PRN

## 2016-01-06 MED ORDER — DIBUCAINE 1 % RE OINT: 1.0000 "application " | TOPICAL_OINTMENT | RECTAL | Status: DC | PRN

## 2016-01-06 MED ORDER — ZOLPIDEM TARTRATE 5 MG PO TABS: 5.0000 mg | ORAL_TABLET | Freq: Every evening | ORAL | Status: DC | PRN

## 2016-01-06 MED ORDER — CITRIC ACID-SODIUM CITRATE 334-500 MG/5ML PO SOLN: 30.0000 mL | ORAL | Status: DC | PRN

## 2016-01-06 MED ORDER — LANOLIN HYDROUS EX OINT: TOPICAL_OINTMENT | CUTANEOUS | Status: DC | PRN

## 2016-01-06 MED ORDER — TETANUS-DIPHTH-ACELL PERTUSSIS 5-2.5-18.5 LF-MCG/0.5 IM SUSP: 0.5000 mL | Freq: Once | INTRAMUSCULAR | Status: DC

## 2016-01-06 MED ORDER — ONDANSETRON HCL 4 MG PO TABS: 4.0000 mg | ORAL_TABLET | ORAL | Status: DC | PRN

## 2016-01-06 MED ORDER — PHENYLEPHRINE 40 MCG/ML (10ML) SYRINGE FOR IV PUSH (FOR BLOOD PRESSURE SUPPORT)
80.0000 ug | PREFILLED_SYRINGE | INTRAVENOUS | Status: DC | PRN
  Filled 2016-01-06: qty 2

## 2016-01-06 MED ORDER — PHENYLEPHRINE 40 MCG/ML (10ML) SYRINGE FOR IV PUSH (FOR BLOOD PRESSURE SUPPORT)
80.0000 ug | PREFILLED_SYRINGE | INTRAVENOUS | Status: DC | PRN
  Filled 2016-01-06: qty 2
  Filled 2016-01-06: qty 20

## 2016-01-06 MED ORDER — OXYCODONE-ACETAMINOPHEN 5-325 MG PO TABS: 1.0000 | ORAL_TABLET | ORAL | Status: DC | PRN

## 2016-01-06 MED ORDER — OXYTOCIN BOLUS FROM INFUSION
500.0000 mL | INTRAVENOUS | Status: DC
  Administered 2016-01-06: 500 mL via INTRAVENOUS

## 2016-01-06 MED ORDER — TERBUTALINE SULFATE 1 MG/ML IJ SOLN
0.2500 mg | Freq: Once | INTRAMUSCULAR | Status: DC | PRN
  Filled 2016-01-06: qty 1

## 2016-01-06 MED ORDER — LIDOCAINE HCL (PF) 1 % IJ SOLN
INTRAMUSCULAR | Status: DC | PRN
  Administered 2016-01-06: 8 mL via EPIDURAL
  Administered 2016-01-06: 6 mL via EPIDURAL

## 2016-01-06 MED ORDER — SIMETHICONE 80 MG PO CHEW: 80.0000 mg | CHEWABLE_TABLET | ORAL | Status: DC | PRN

## 2016-01-06 MED ORDER — EPHEDRINE 5 MG/ML INJ
10.0000 mg | INTRAVENOUS | Status: DC | PRN
  Filled 2016-01-06: qty 2

## 2016-01-06 MED ORDER — ONDANSETRON HCL 4 MG/2ML IJ SOLN: 4.0000 mg | INTRAMUSCULAR | Status: DC | PRN

## 2016-01-06 MED ORDER — FENTANYL 2.5 MCG/ML BUPIVACAINE 1/10 % EPIDURAL INFUSION (WH - ANES)
14.0000 mL/h | INTRAMUSCULAR | Status: DC | PRN
  Administered 2016-01-06: 14 mL/h via EPIDURAL
  Filled 2016-01-06: qty 125

## 2016-01-06 MED ORDER — PRENATAL MULTIVITAMIN CH
1.0000 | ORAL_TABLET | Freq: Every day | ORAL | Status: DC
  Administered 2016-01-07 – 2016-01-08 (×2): 1 via ORAL
  Filled 2016-01-06 (×2): qty 1

## 2016-01-06 MED ORDER — IBUPROFEN 600 MG PO TABS
600.0000 mg | ORAL_TABLET | Freq: Four times a day (QID) | ORAL | Status: DC
  Administered 2016-01-06 – 2016-01-08 (×9): 600 mg via ORAL
  Filled 2016-01-06 (×8): qty 1

## 2016-01-06 MED ORDER — DIPHENHYDRAMINE HCL 25 MG PO CAPS: 25.0000 mg | ORAL_CAPSULE | Freq: Four times a day (QID) | ORAL | Status: DC | PRN

## 2016-01-06 MED ORDER — LACTATED RINGERS IV SOLN
INTRAVENOUS | Status: DC
  Administered 2016-01-06: 11:00:00 via INTRAVENOUS

## 2016-01-06 MED ORDER — ONDANSETRON HCL 4 MG/2ML IJ SOLN: 4.0000 mg | Freq: Four times a day (QID) | INTRAMUSCULAR | Status: DC | PRN

## 2016-01-06 MED ORDER — OXYTOCIN 10 UNIT/ML IJ SOLN
2.5000 [IU]/h | INTRAVENOUS | Status: DC
  Filled 2016-01-06: qty 4

## 2016-01-06 MED ORDER — LACTATED RINGERS IV SOLN
500.0000 mL | Freq: Once | INTRAVENOUS | Status: AC
  Administered 2016-01-06: 500 mL via INTRAVENOUS

## 2016-01-06 NOTE — MAU Note (Signed)
Notified Dr. Senaida Ores patient seen this past week in office was 5 cm, today irregular contractions mostly irritability 6/80, patient not uncomfortable, Dr. Senaida Ores to come see patient.

## 2016-01-06 NOTE — Anesthesia Procedure Notes (Signed)
Epidural Patient location during procedure: OB Start time: 01/06/2016 11:35 AM End time: 01/06/2016 11:39 AM  Staffing Anesthesiologist: Leilani Able Performed by: anesthesiologist   Preanesthetic Checklist Completed: patient identified, surgical consent, pre-op evaluation, timeout performed, IV checked, risks and benefits discussed and monitors and equipment checked  Epidural Patient position: sitting Prep: site prepped and draped and DuraPrep Patient monitoring: continuous pulse ox and blood pressure Approach: midline Location: L3-L4 Injection technique: LOR air  Needle:  Needle type: Tuohy  Needle gauge: 17 G Needle length: 9 cm and 9 Needle insertion depth: 5 cm cm Catheter type: closed end flexible Catheter size: 19 Gauge Catheter at skin depth: 10 cm Test dose: negative and Other  Assessment Sensory level: T9 Events: blood not aspirated, injection not painful, no injection resistance, negative IV test and no paresthesia  Additional Notes Reason for block:procedure for pain

## 2016-01-06 NOTE — H&P (Signed)
Christine Blair is a 30 y.o. female G3P1011 at 37 weeks (EDD 01/27/16 by 11 week [place] inconsistent with LMP) presenting for regular mild/moderate contractions.  Last exam in office cervix noted to be 90/4-5cm.  Prenatal care has been relatively uncomplicated.  Possible amniotic band on earlier [place], resolved.  Size less than dates, but [place] 12/19/15 showed baby at 25%ile and normal AFI.  Upon exam in MAU pt noted to have SROM.  Maternal Medical History:  Reason for admission: Rupture of membranes and contractions.   Contractions: Onset was 3-5 hours ago.   Frequency: regular.   Perceived severity is mild.    Fetal activity: Perceived fetal activity is normal.    Prenatal Complications - Diabetes: none.    OB History    Gravida Para Term Preterm AB TAB SAB Ectopic Multiple Living   0 1 0 1 0 0 1    2013 NSVD 5#12oz 37 weeks 2015 SAB x 1 with D&C   Past Medical History  Diagnosis Date  . 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 No past surgeries    . Dilation and evacuation N/A 02/13/2014    Procedure: DILATATION AND EVACUATION;  Surgeon: Oliver Pila, MD;  Location: [facility];  Service: Gynecology;  Laterality: N/A;   Family History: family history includes Diabetes in her paternal grandmother; Hypertension in her paternal grandmother. There is no history of Anesthesia problems. Social History:  reports that she has never smoked. She does not have any smokeless tobacco history on file. She reports that she does not drink alcohol or use illicit drugs.   Prenatal Transfer Tool  Maternal Diabetes: No Genetic Screening: Normal Maternal Ultrasounds/Referrals: Normal Fetal Ultrasounds or other Referrals:  None Maternal Substance Abuse:  No Significant Maternal Medications:  None Significant Maternal Lab Results:  None Other Comments:  None  Review of Systems  Gastrointestinal: Positive for abdominal pain.    Dilation: 6.5 Effacement (%):  90 Station: -2 Exam by:: Dr. Senaida Ores Blood pressure 122/87, pulse 98, temperature 97.3 F (36.3 C), temperature source Oral, resp. rate 18, last menstrual period 04/28/2015, unknown if currently breastfeeding. Exam Physical Exam  Prenatal labs: ABO, Rh: O/Positive/-- (08/15 0000) Antibody: Negative (08/15 0000) Rubella: Immune (08/15 0000) RPR: Nonreactive (08/15 0000)  HBsAg: Negative (08/15 0000)  HIV: Non-reactive (08/15 0000)  GBS:   Negative 01/01/16 First trimester screen negative  One hour GTT 114   Assessment/Plan: Pt with contractions and advanced cervical dilation with SROM noted on exam in MAU.  Will be admitted and allow epidural as desires.  Augment as needed with pitocin   Sharnita Bogucki W 01/06/2016, 10:43 AM

## 2016-01-06 NOTE — Anesthesia Preprocedure Evaluation (Signed)
Anesthesia Evaluation  Patient identified by MRN, date of birth, ID band Patient awake    Reviewed: Allergy & Precautions, H&P , NPO status , Patient's Chart, lab work & pertinent test results  Airway Mallampati: I  TM Distance: >3 FB Neck ROM: full    Dental no notable dental hx.    Pulmonary neg pulmonary ROS,    Pulmonary exam normal        Cardiovascular negative cardio ROS Normal cardiovascular exam     Neuro/Psych negative psych ROS   GI/Hepatic negative GI ROS, Neg liver ROS,   Endo/Other  negative endocrine ROS  Renal/GU negative Renal ROS     Musculoskeletal   Abdominal Normal abdominal exam  (+)   Peds  Hematology negative hematology ROS (+)   Anesthesia Other Findings   Reproductive/Obstetrics (+) Pregnancy                             Anesthesia Physical Anesthesia Plan  ASA: II  Anesthesia Plan: Epidural   Post-op Pain Management:    Induction:   Airway Management Planned:   Additional Equipment:   Intra-op Plan:   Post-operative Plan:   Informed Consent: I have reviewed the patients History and Physical, chart, labs and discussed the procedure including the risks, benefits and alternatives for the proposed anesthesia with the patient or authorized representative who has indicated his/her understanding and acceptance.     Plan Discussed with:   Anesthesia Plan Comments:         Anesthesia Quick Evaluation  

## 2016-01-06 NOTE — MAU Note (Signed)
Pt states she was checked last week, was 5 cm's.  C/O abd tightening & cramping since yesterday, became more intense during the night.  Denies bleeding or LOF.

## 2016-01-07 LAB — CBC
HEMATOCRIT: 35.4 % — AB (ref 36.0–46.0)
HEMOGLOBIN: 12 g/dL (ref 12.0–15.0)
MCH: 29.7 pg (ref 26.0–34.0)
MCHC: 33.9 g/dL (ref 30.0–36.0)
MCV: 87.6 fL (ref 78.0–100.0)
Platelets: 226 10*3/uL (ref 150–400)
RBC: 4.04 MIL/uL (ref 3.87–5.11)
RDW: 13.6 % (ref 11.5–15.5)
WBC: 17.6 10*3/uL — ABNORMAL HIGH (ref 4.0–10.5)

## 2016-01-07 LAB — RPR: RPR: NONREACTIVE

## 2016-01-07 NOTE — Progress Notes (Signed)
UR chart review completed.  

## 2016-01-07 NOTE — Progress Notes (Signed)
Post Partum Day 1 Subjective: no complaints and tolerating PO  Objective: Blood pressure 114/67, pulse 72, temperature 97.8 F (36.6 C), temperature source Oral, resp. rate 18, height  (1.6 m), weight 61.236 kg (135 lb), last menstrual period 04/28/2015, SpO2 97 %, unknown if currently breastfeeding.  Physical Exam:  General: alert and cooperative Lochia: appropriate Uterine Fundus: firm    Recent Labs  01/06/16 1055 01/07/16 0536  HGB 13.1 12.0  HCT 37.9 35.4*    Assessment/Plan: Plan for discharge tomorrow   LOS: 1 day   Atif Chapple W 01/07/2016, 8:45 AM

## 2016-01-07 NOTE — Anesthesia Postprocedure Evaluation (Addendum)
Anesthesia Post Note  Patient: Christine Blair  Procedure(s) Performed: * No procedures listed *  Patient location during evaluation: Mother Baby Anesthesia Type: Epidural Level of consciousness: awake Pain management: satisfactory to patient Vital Signs Assessment: post-procedure vital signs reviewed and stable Respiratory status: spontaneous breathing Cardiovascular status: stable Anesthetic complications: no Comments: Current pain score 0, pain goal 3-5    Last Vitals:  Filed Vitals:   01/06/16 2001 01/07/16 0540  BP: 114/66 114/67  Pulse: 84 72  Temp: 36.9 C 36.6 C  Resp: 18 18    Last Pain:  Filed Vitals:   01/07/16 0548  PainSc: 1                  Azzie Thiem

## 2016-01-08 MED ORDER — IBUPROFEN 600 MG PO TABS: 600.0000 mg | ORAL_TABLET | Freq: Four times a day (QID) | ORAL | Status: AC

## 2016-01-08 NOTE — Progress Notes (Signed)
Post Partum Day 2 Subjective: no complaints, up ad lib and tolerating PO  Objective: Blood pressure 117/74, pulse 67, temperature 97.9 F (36.6 C), temperature source Oral, resp. rate 18, height  (1.6 m), weight 61.236 kg (135 lb), last menstrual period 04/28/2015, SpO2 97 %, unknown if currently breastfeeding.  Physical Exam:  General: alert and cooperative Lochia: appropriate Uterine Fundus: firm   Recent Labs  01/06/16 1055 01/07/16 0536  HGB 13.1 12.0  HCT 37.9 35.4*    Assessment/Plan: Discharge home   LOS: 2 days   Amrutha Avera W 01/08/2016, 8:36 AM

## 2016-01-08 NOTE — Discharge Summary (Signed)
Obstetric Discharge Summary Reason for Admission: onset of labor and rupture of membranes Prenatal Procedures: none Intrapartum Procedures: spontaneous vaginal delivery Postpartum Procedures: none Complications-Operative and Postpartum: none HEMOGLOBIN  Date Value Ref Range Status  01/07/2016 12.0 12.0 - 15.0 g/dL Final   HCT  Date Value Ref Range Status  01/07/2016 35.4* 36.0 - 46.0 % Final    Physical Exam:  General: alert and cooperative Lochia: appropriate Uterine Fundus: firm   Discharge Diagnoses: Term Pregnancy-delivered  Discharge Information: Date: 01/08/2016 Activity: pelvic rest Diet: routine Medications: Ibuprofen Condition: improved Instructions: refer to practice specific booklet Discharge to: home Follow-up Information    Follow up with Oliver Pila, MD.   Specialty:  Obstetrics and Gynecology   Why:  postpartum   Contact information:   [address] [place] [address] [phone]       Newborn Data: Live born female  Birth Weight: 6 lb 11.1 oz (3035 g) APGAR: 8, 9  Home with mother.  Oliver Pila 01/08/2016, 8:37 AM

## 2016-04-07 ENCOUNTER — Inpatient Hospital Stay (HOSPITAL_COMMUNITY): Admission: RE | Admit: 2016-04-07 | Payer: Medicaid Other | Source: Ambulatory Visit

## 2016-04-14 ENCOUNTER — Ambulatory Visit (HOSPITAL_COMMUNITY)
Admission: RE | Admit: 2016-04-14 | Payer: Medicaid Other | Source: Ambulatory Visit | Admitting: Obstetrics and Gynecology

## 2016-04-14 ENCOUNTER — Encounter (HOSPITAL_COMMUNITY): Admission: RE | Payer: Self-pay | Source: Ambulatory Visit

## 2016-04-14 SURGERY — LIGATION, FALLOPIAN TUBE, LAPAROSCOPIC
Anesthesia: General | Laterality: Bilateral

## 2017-02-20 IMAGING — US US MFM OB FOLLOW-UP
1 series · 14 of 28 positions shown · non-contrast
Comparison: none

[Series 1: us mfm ob follow-up · 57 acquisitions, 14 frames shown]
[im 3/57]
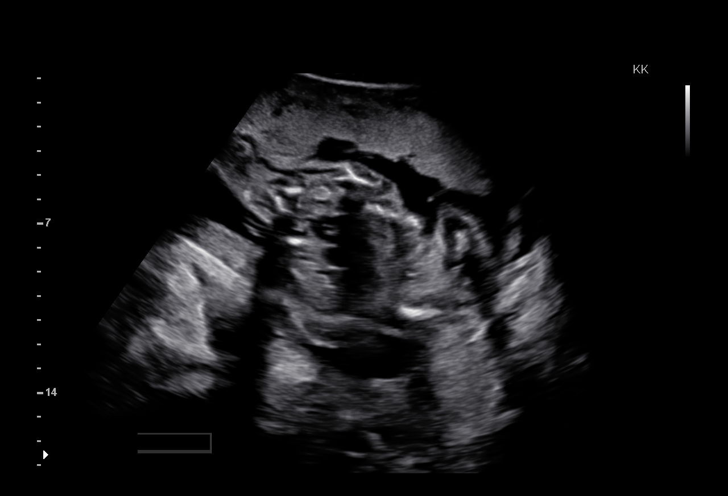
[im 7/57]
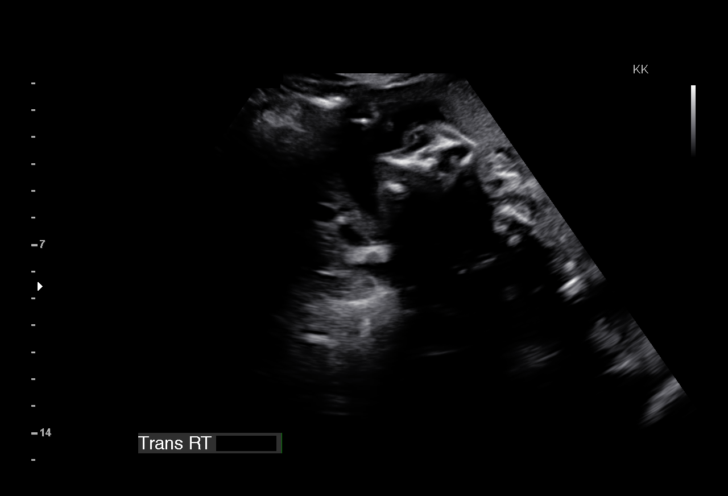
[im 11/57]
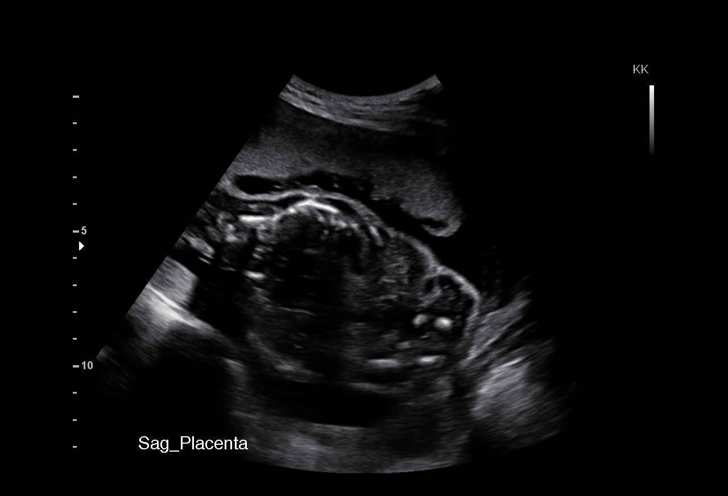
[im 15/57]
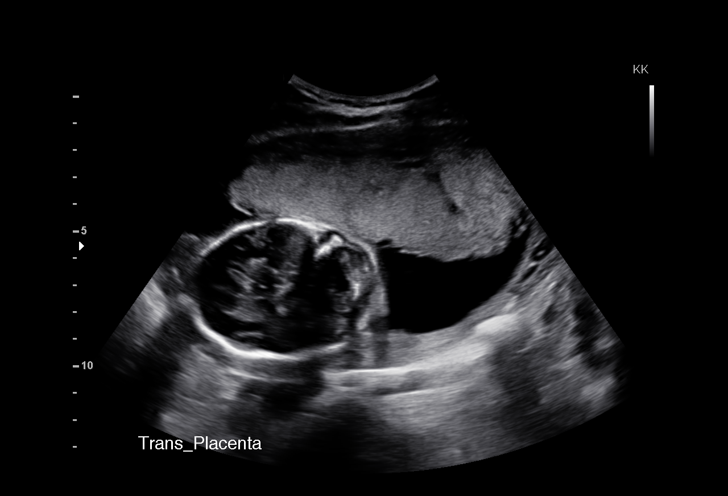
[im 19/57]
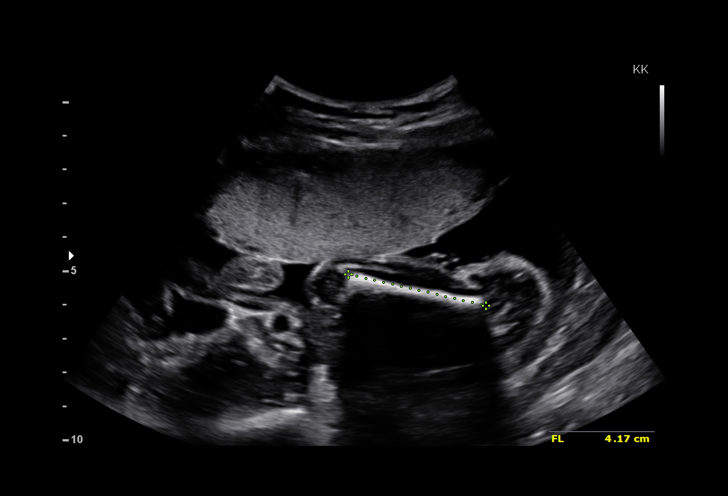
[im 23/57]
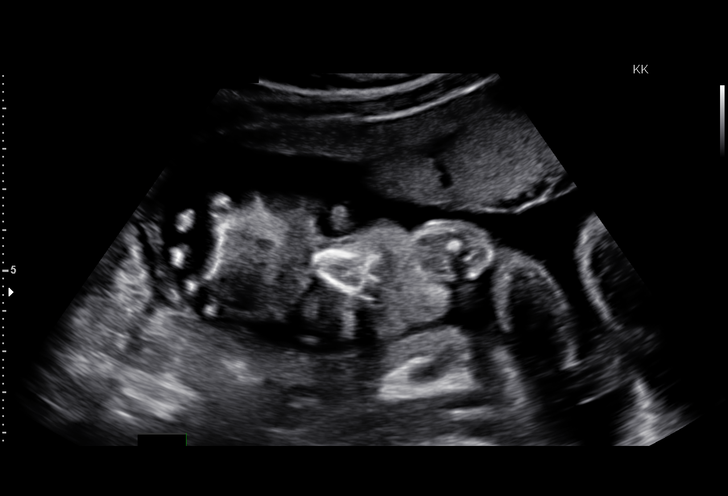
[im 27/57]
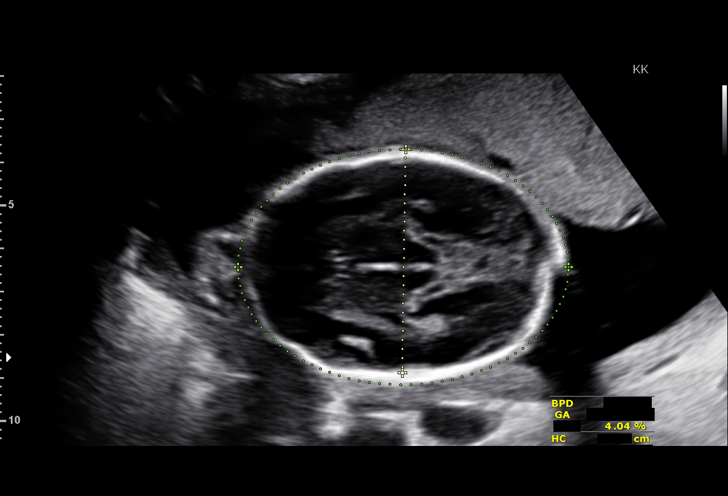
[im 32/57]
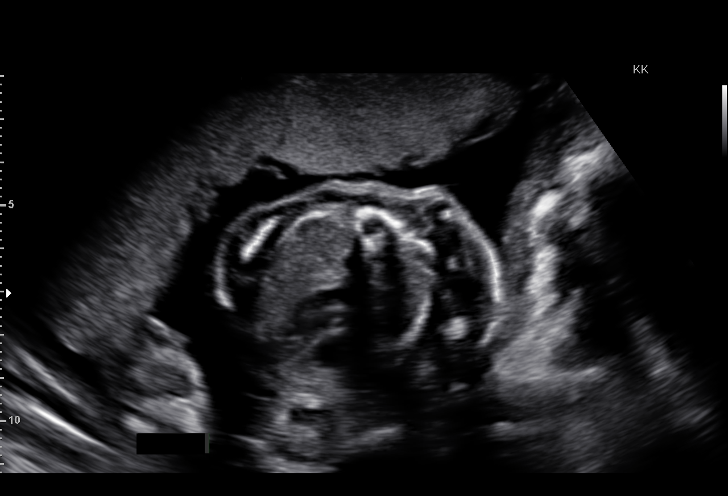
[im 36/57]
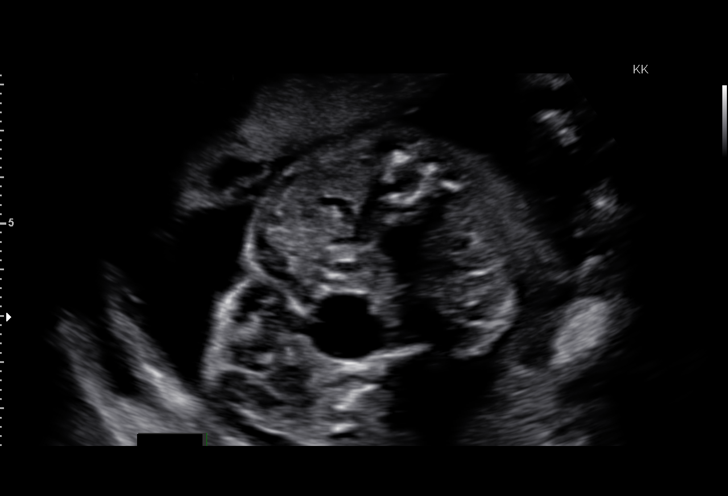
[im 40/57]
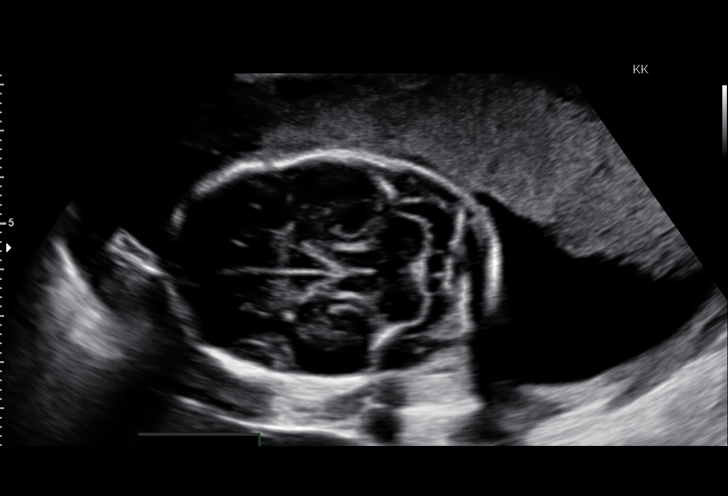
[im 44/57]
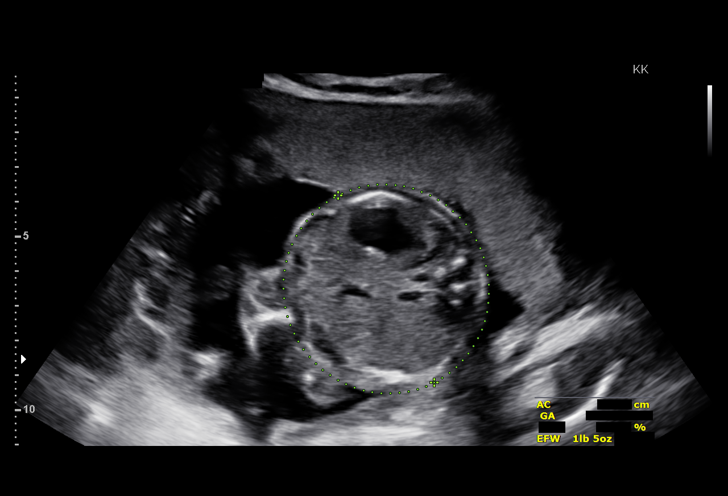
[im 48/57]
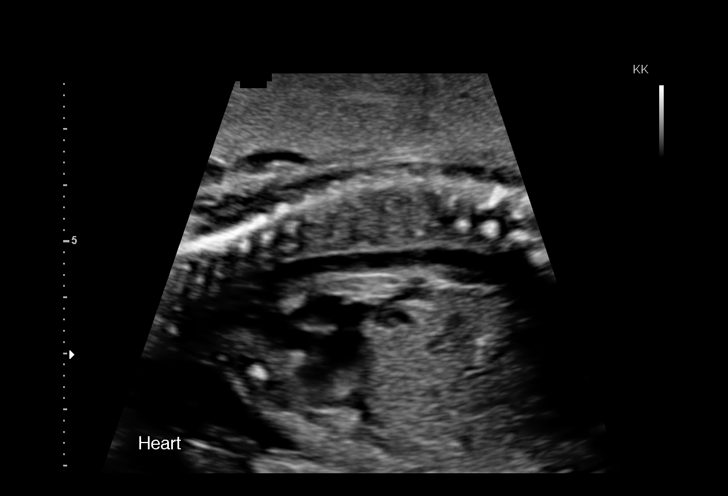
[im 52/57]
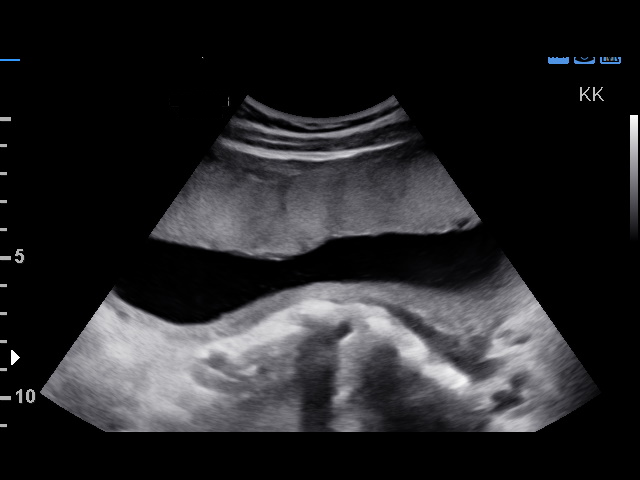
[im 57/57]
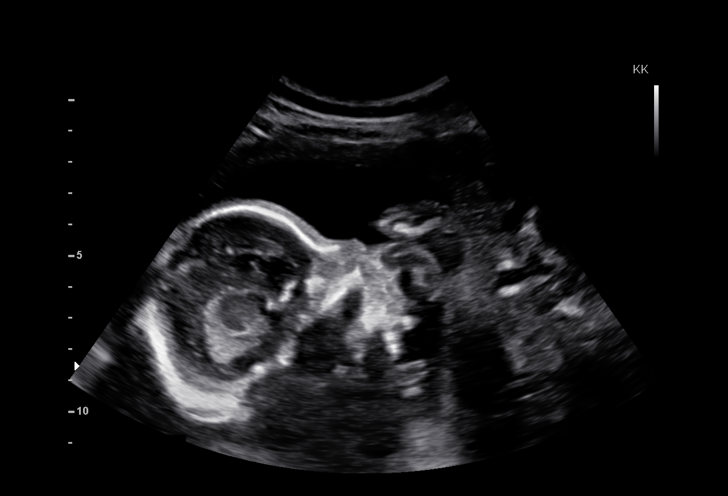

[14 of 28 positions shown; findings below may reference images not displayed]

OBSTETRICS REPORT
(Signed Final 10/02/2015 [DATE])

Date:

Service(s) Provided

Indications

Amniotic band
Placenta previa specified as without
hemhorrhage, second trimester
23 weeks gestation of pregnancy
Fetal Evaluation

Num Of             1
Fetuses:
Fetal Heart        137                          bpm
Rate:
Cardiac Activity:  Observed
Presentation:      Breech
Placenta:          Anterior, above cervical
os
P. Cord            Previously Visualized
Insertion:

Amniotic Fluid
AFI FV:      Subjectively within normal limits
Larg Pckt:      3.8  cm
Biometry

BPD:     51.6   m    G. Age:   21w 5d                 CI:        64.15   70 - 86
m
FL/HC:      20.1   19.2 -
20.8
HC:     207.3   m    G. Age:   22w 6d        19  %    HC/AC:      1.10   1.05 -
m
AC:     188.7   m    G. Age:   23w 5d        52  %    FL/BPD      80.6   71 - 87
m                                     :
FL:      41.6   m    G. Age:   23w 4d        46  %    FL/AC:      22.0   20 - 24
m

Est.         592   gm    1 lb 5 oz      55   %
FW:
Gestational Age

LMP:           22w 3d        Date:  04/28/15                  EDD:   02/02/16
U/S Today:     23w 0d                                         EDD:   01/29/16
Best:          23w 2d    Det. By:   Early Ultrasound          EDD:   01/27/16
(07/09/15)
Anatomy

Cranium:          Appears normal         Aortic Arch:       Appears normal
Fetal Cavum:      Appears normal         Ductal Arch:       Appears normal
Ventricles:       Appears normal         Diaphragm:         Appears normal
Choroid Plexus:   Previously seen        Stomach:           Appears normal,
left sided
Cerebellum:       Appears normal         Abdomen:           Appears normal
Posterior         Previously seen        Abdominal          Previously seen
Fossa:                                   Wall:
Nuchal Fold:      Not applicable (>20    Cord Vessels:      Previously seen
wks GA)
Face:             Orbits and profile     Kidneys:           Appear normal
previously seen
Lips:             Appears normal         Bladder:           Appears normal
Heart:            Previously seen        Spine:             Not well visualized
RVOT:             Previously seen        Lower              Previously seen
Extremities:
LVOT:             Previously seen        Upper              Previously seen
Extremities:

Other:   Fetus appears to be a male. Heels,5th digit, and Nasal bone
previously visualized. Technically difficult due to fetal position.
Cervix Uterus Adnexa

Cervical Length:    2.9       cm

Cervix:       Normal appearance by transabdominal scan.

Adnexa:     No abnormality visualized.
Impression

Single IUP at 23w 2d
Follow up due to possible amniotic bands
Interval growth is appropriate (55th %tile)
The previously noted amniotic bands are less obvious and
difficult to appreciate on today's study
The fetal extremities are well away from the area where
amniotic bands were appreciated
Normal amniotic fluid volume
Recommendations

Recommend follow-up ultrasound examination in 4 weeks -
will reevaluate for possible amniotic bands at that time.
If bandsare not appreciated at that time, no further follow up
recommended.

## 2017-03-20 IMAGING — US US MFM OB FOLLOW-UP
1 series · 14 of 28 positions shown · non-contrast
Comparison: none

[Series 1: us mfm ob follow-up · 80 acquisitions, 14 frames shown]
[im 3/80]
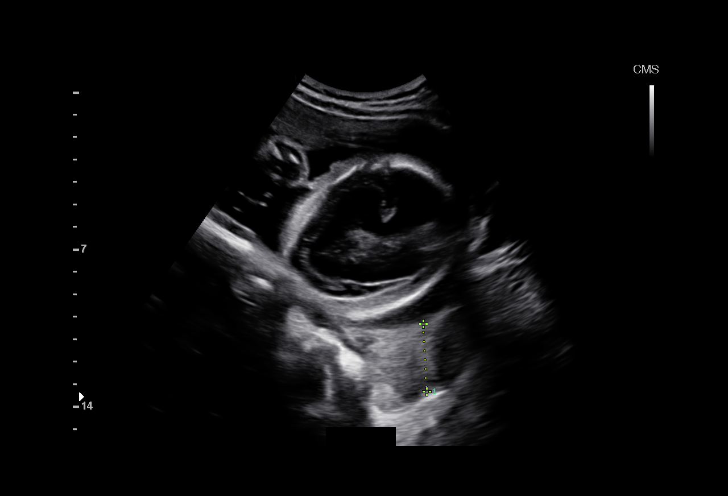
[im 9/80]
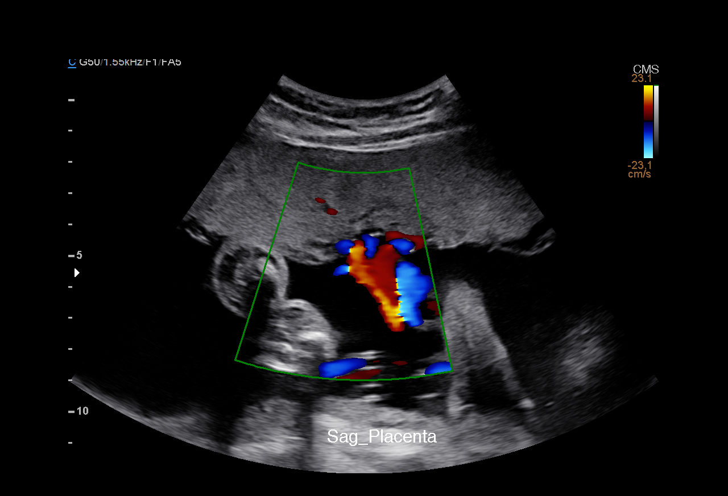
[im 15/80]
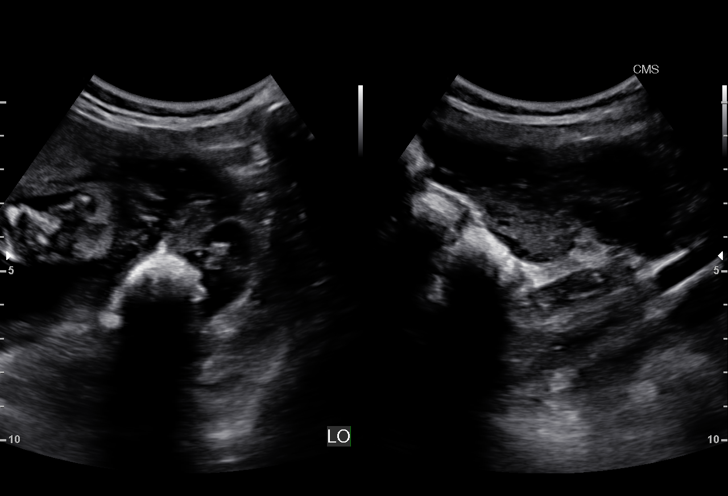
[im 21/80]
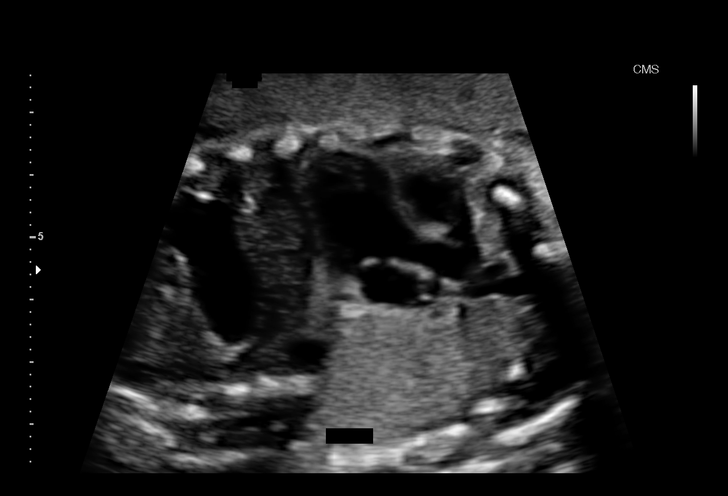
[im 27/80]
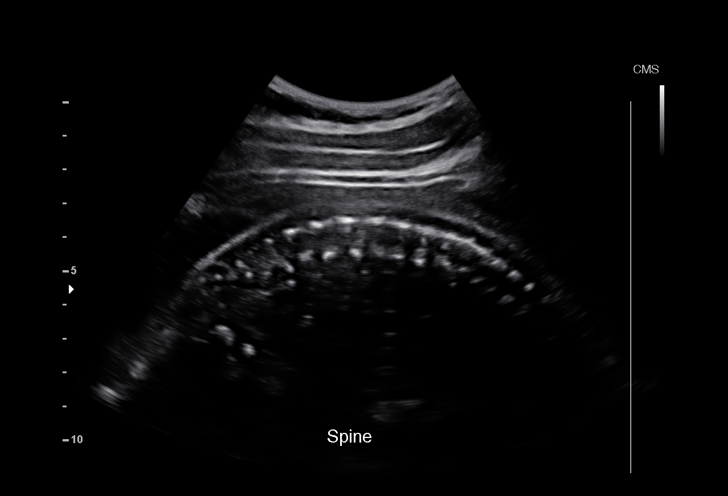
[im 33/80]
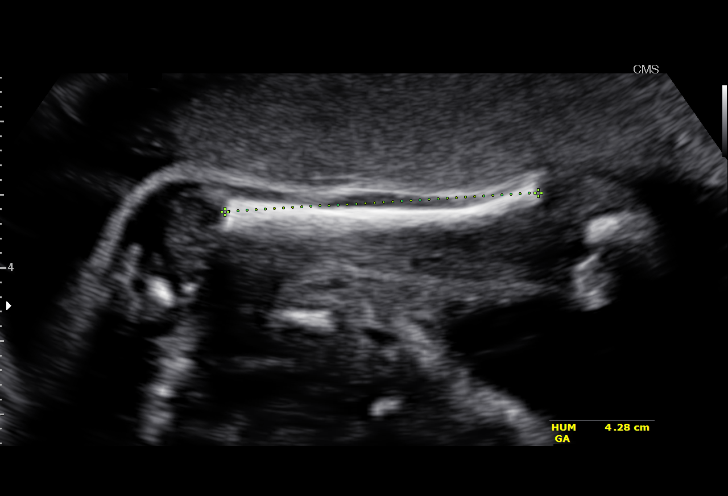
[im 39/80]
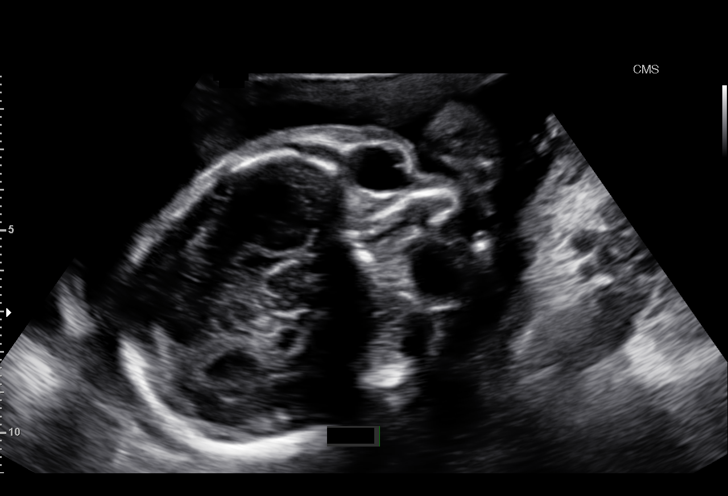
[im 44/80]
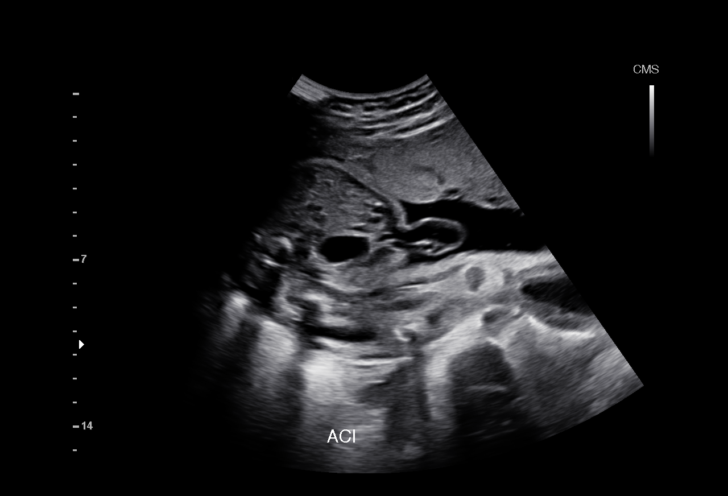
[im 50/80]
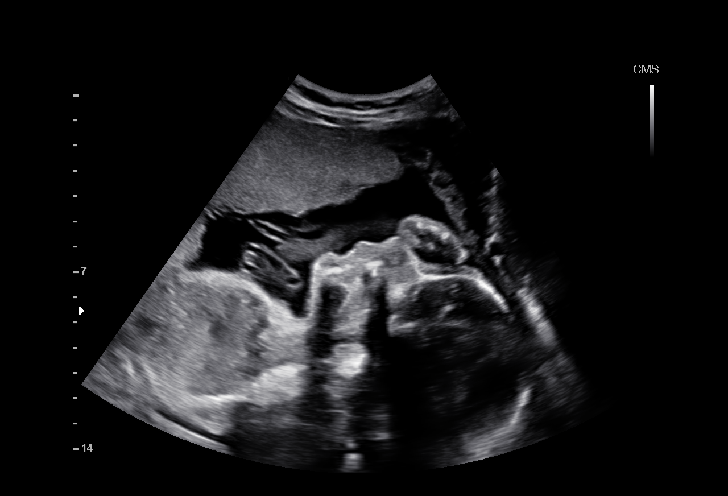
[im 56/80]
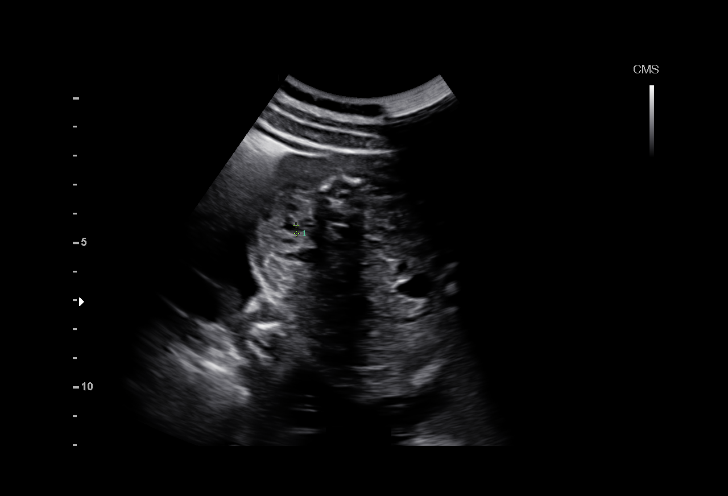
[im 62/80]
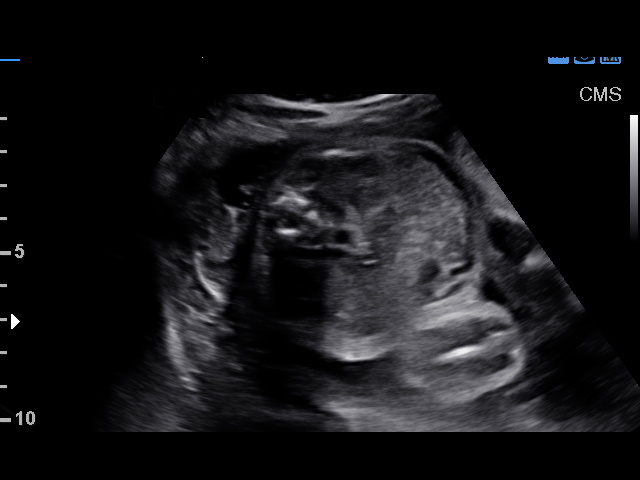
[im 68/80]
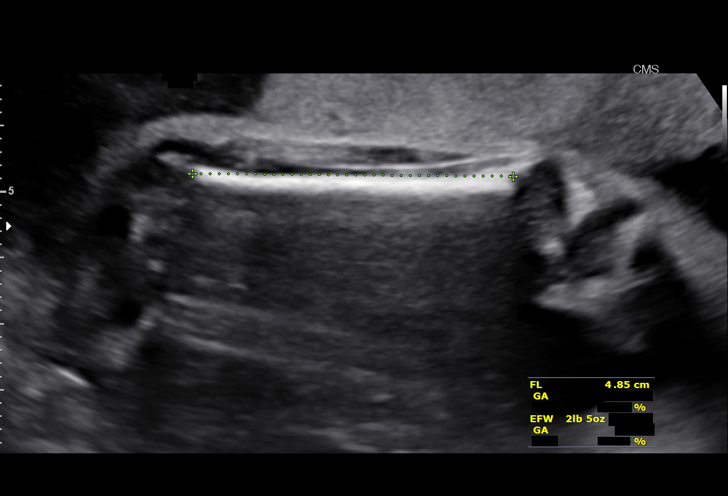
[im 74/80]
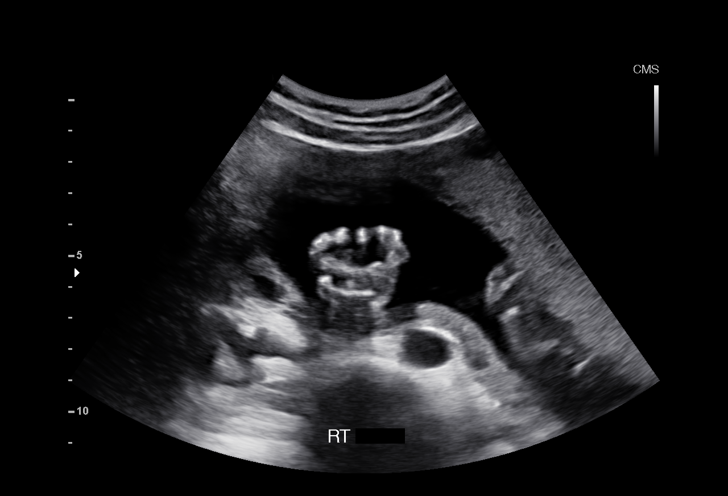
[im 80/80]
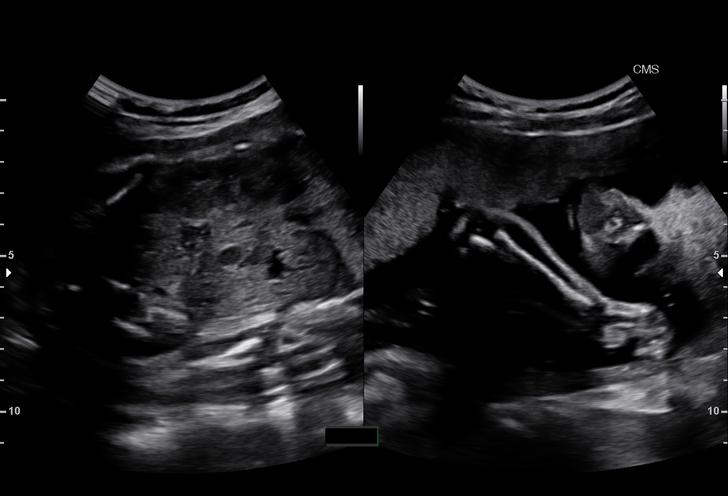

[14 of 28 positions shown; findings below may reference images not displayed]

pm)

Date:

1  ELNAZ AZEEZ            [id number]       [id number]     [id number]
Indications

Amniotic band
27 weeks gestation of pregnancy
OB History

Gravidity:     3         Term:  1        Prem:    0        SAB:   1
TOP:           0       Ectopic  0        Living:  1
:
Fetal Evaluation

Num Of Fetuses:      1
Fetal Heart          134
Rate(bpm):
Cardiac Activity:    Observed
Presentation:        Cephalic
Placenta:            Anterior, above cervical os
P. Cord Insertion:   Visualized, central

Amniotic Fluid
AFI FV:      Subjectively within normal limits
Larg Pckt:    5.91   cm
Biometry

BPD:      71.3  mm     G. Age:   28w 4d                  CI:        68.82   %    70 - 86
FL/HC:      17.7   %    18.6 -
HC:      274.6  mm     G. Age:   30w 0d        93   %    HC/AC:      1.21        1.05 -
AC:      227.1  mm     G. Age:   27w 1d        37   %    FL/BPD      68.0   %    71 - 87
:
FL:       48.5  mm     G. Age:   26w 2d        12   %    FL/AC:      21.4   %    20 - 24
HUM:      42.8  mm     G. Age:   25w 4d          8  %

Est.        9539   gm    2 lb 5 oz      50   %
FW:
Gestational Age

LMP:           26w 3d        Date:  04/28/15                  EDD:   02/02/16
U/S Today:     28w 0d                                         EDD:   01/22/16
Best:          27w 2d    Det. By:   Early Ultrasound          EDD:   01/27/16
(07/09/15)
Anatomy

Cranium:          Appears normal         Aortic Arch:       Appears normal
Fetal Cavum:      Appears normal         Ductal Arch:       Appears normal
Ventricles:       Appears normal         Diaphragm:         Appears normal
Choroid Plexus:   Appears normal         Stomach:           Appears normal,
left sided
Cerebellum:       Appears normal         Abdomen:           Appears normal
Posterior         Previously seen        Abdominal          Appears nml (cord
Fossa:                                   Wall:              insert, abd wall)
Nuchal Fold:      Not applicable (>20    Cord Vessels:      Appears normal (3
wks GA)                                   vessel cord)
Face:             Appears normal         Kidneys:           Appear normal
(orbits and profile)
Lips:             Appears normal         Bladder:           Appears normal
Heart:            Appears normal         Spine:             Appears normal
(4CH, axis, and
situs
RVOT:             Appears normal         Upper              Previously seen
Extremities:
LVOT:             Appears normal         Lower              Previously seen
Extremities:

Other:   Fetus appears to be a male. Heels appear normal. 5th digit, and
nasal bone previously visualized.
Cervix Uterus Adnexa

Cervix
Length:           3.02   cm.
Normal appearance by transabdominal scan.

Uterus
No abnormality visualized.

Left Ovary
Within normal limits.

Right Ovary
Within normal limits.

Cul De        No free fluid seen.
Sac:

Adnexa:       No abnormality visualized.
Impression

SIUP at 27+2 weeks
Normal interval anatomy; anatomic survey complete; all
extremities appeared normal
Normal amniotic fluid volume
Appropriate interval growth with EFW at the 50th %tile
No bands identified
Recommendations

Follow-up ultrasounds as clinically indicated.

## 2018-08-18 ENCOUNTER — Emergency Department (HOSPITAL_COMMUNITY)
Admission: EM | Admit: 2018-08-18 | Discharge: 2018-08-18 | Disposition: A | Discharge status: Home / Self Care | Payer: Self-pay | Attending: Emergency Medicine | Admitting: Emergency Medicine

## 2018-08-18 ENCOUNTER — Encounter: Payer: Self-pay | Admitting: Emergency Medicine

## 2018-08-18 DIAGNOSIS — R519 Headache, unspecified: Secondary | ICD-10-CM

## 2018-08-18 DIAGNOSIS — R51 Headache: Secondary | ICD-10-CM | POA: Insufficient documentation

## 2018-08-18 MED ORDER — DIPHENHYDRAMINE HCL 25 MG PO CAPS
25.0000 mg | ORAL_CAPSULE | Freq: Once | ORAL | Status: AC
  Administered 2018-08-18: 25 mg via ORAL
  Filled 2018-08-18: qty 1

## 2018-08-18 MED ORDER — PROCHLORPERAZINE MALEATE 5 MG PO TABS
10.0000 mg | ORAL_TABLET | Freq: Once | ORAL | Status: AC
  Administered 2018-08-18: 10 mg via ORAL
  Filled 2018-08-18: qty 2

## 2018-08-18 NOTE — ED Notes (Signed)
Pt states has had bad h/a at least once a month x the last 6 months , painn is bad she feels nauseated , denies dizziness, states has never seen a dr for any of this, feels it may be stress because she has 2 jobs and  2 kids

## 2018-08-18 NOTE — Discharge Instructions (Signed)
I have provided a referral to the [facility] health and wellness clinic please call them to establish care and make an appointment for further management of your headaches.You may alternate ibuprofen or tylenol for your headache. If you experience any dizziness, vomiting or your symptoms worsen return to the ED for reevaluation.

## 2018-08-18 NOTE — ED Triage Notes (Signed)
Pt is here for a headache.  She states that she has frequent headaches since she was small but she has not been evaluated for them previously.  Pt reports that she took tylenol pta. Pt states that her HA is frontal at this time.  No neurological changes with this, no photophobia with this, no sensitivity to sound. Pt is alert and oriented.

## 2018-08-18 NOTE — ED Provider Notes (Signed)
[facility] EMERGENCY DEPARTMENT Provider Note   CSN: [id number] Arrival date & time: 08/18/18  1207     History   Chief Complaint Chief Complaint  Patient presents with  . Headache    HPI Christine Blair is a 32 y.o. female.  32 y/o female with no PMH presents to the ED with a chief complaint of recurrent headache. Patient reports the headache is mainly in the front of her head with no radiation. Patient states she's had these headaches for years but has never been evaluated for them by a medical provider. Patient reports she takes tylenol (4) and this usually relieves the pain.She denies any phototopia, phonophobia,dizziness, fever, or trauma.      Past Medical History:  Diagnosis Date  . [id number](478.2)     Patient Active Problem List   Diagnosis Date Noted  . Indication for care in labor and delivery, antepartum 01/06/2016  . NSVD (normal spontaneous vaginal delivery) 01/06/2016    Past Surgical History:  Procedure Laterality Date  . DILATION AND EVACUATION N/A 02/13/2014   Procedure: DILATATION AND EVACUATION;  Surgeon: Oliver Pila, MD;  Location: [facility];  Service: Gynecology;  Laterality: N/A;  . NO PAST SURGERIES       OB History    Gravida  3   Para  2   Term  2   Preterm  0   AB  1   Living  2     SAB  1   TAB  0   Ectopic  0   Multiple  0   Live Births  2            Home Medications    Prior to Admission medications   Medication Sig Start Date End Date Taking? Authorizing Provider  acetaminophen (TYLENOL) 325 MG tablet Take 650 mg by mouth every 6 (six) hours as needed for mild pain or headache.    [provider]  ibuprofen (ADVIL,MOTRIN) 600 MG tablet Take 1 tablet (600 mg total) by mouth every 6 (six) hours. Patient not taking: Reported on 03/26/2016 01/08/16   Huel Cote, MD  Norethin Ace-Eth Estrad-FE (TAYTULLA PO) Take 1 tablet by mouth daily.    [provider]    Family  History Family History  Problem Relation Age of Onset  . Diabetes Paternal Grandmother   . Hypertension Paternal Grandmother   . Anesthesia problems Neg Hx     Social History Social History   Tobacco Use  . Smoking status: Never Smoker  Substance Use Topics  . Alcohol use: No  . Drug use: No     Allergies   Patient has no known allergies.   Review of Systems Review of Systems  Constitutional: Negative for fever.  Gastrointestinal: Positive for nausea. Negative for vomiting.  Neurological: Positive for headaches.  All other systems reviewed and are negative.    Physical Exam Updated Vital Signs BP 124/75   Pulse 82   Temp 98 F (36.7 C) (Oral)   Resp 16   Wt 52.2 kg   LMP 08/11/2018 (Approximate)   SpO2 100%   BMI 20.37 kg/m   Physical Exam  Constitutional: She is oriented to person, place, and time. She appears well-developed and well-nourished.  HENT:  Head: Normocephalic and atraumatic.  Eyes: Pupils are equal, round, and reactive to light. Right pupil is round and reactive. Left pupil is round and reactive. Pupils are equal.  Neck: Normal range of motion. Neck supple.  Cardiovascular: Normal  rate.  Pulmonary/Chest: Effort normal and breath sounds normal.  Abdominal: Soft.  Musculoskeletal: She exhibits no edema or tenderness.  Neurological: She is alert and oriented to person, place, and time. GCS eye subscore is 4. GCS verbal subscore is 5. GCS motor subscore is 6.  Alert, oriented, thought content appropriate. Speech fluent without evidence of aphasia. Able to follow 2 step commands without difficulty.  Cranial Nerves:  II:  Peripheral visual fields grossly normal, pupils, round, reactive to light III,IV, VI: ptosis not present, extra-ocular motions intact bilaterally  V,VII: smile symmetric, facial light touch sensation equal VIII: hearing grossly normal bilaterally  IX,X: midline uvula rise  XI: bilateral shoulder shrug equal and strong XII:  midline tongue extension  Motor:  5/5 in upper and lower extremities bilaterally including strong and equal grip strength and dorsiflexion/plantar flexion Sensory: light touch normal in all extremities.  Cerebellar: normal finger-to-nose with bilateral upper extremities, pronator drift negative Gait: normal gait and balance  Skin: Skin is warm and dry.  Nursing note and vitals reviewed.    ED Treatments / Results  Labs (all labs ordered are listed, but only abnormal results are displayed) Labs Reviewed - No data to display  EKG None  Radiology No results found.  Procedures Procedures (including critical care time)  Medications Ordered in ED Medications  prochlorperazine (COMPAZINE) tablet 10 mg (10 mg Oral Given 08/18/18 1312)  diphenhydrAMINE (BENADRYL) capsule 25 mg (25 mg Oral Given 08/18/18 1313)     Initial Impression / Assessment and Plan / ED Course  I have reviewed the triage vital signs and the nursing notes.  Pertinent labs & imaging results that were available during my care of the patient were reviewed by me and considered in my medical decision making (see chart for details).    Patient presents with recurrent history of headaches.  She states she has never been evaluated by a primary care for her headaches but she reports a headache mainly in the frontal region with no radiation she any photophobia, dizziness, nausea, vomiting.  During evaluation patient is neurologically intact she has no fever, no neck rigidity or other red flag symptoms.  This is a chronic complaint has been going on for multiple years.  I have given patient [facility] in order to establish care and further manage her headaches.  Patient will receive it for headache today with Compazine along with Benadryl.  Patient understands and agrees with plan.  Vitals stable for discharge, stable for discharge.  Return precautions provided. Final Clinical Impressions(s) / ED  Diagnoses   Final diagnoses:  Acute intractable headache, unspecified headache type    ED Discharge Orders    None       Claude MangesSoto, Alania Overholt, Cordelia Poche-C 08/18/18 1317    Mesner, Barbara CowerJason, MD 08/18/18 1454
